# Patient Record
Sex: Female | Born: 1980 | Race: White | Hispanic: No | Marital: Married | Smoking: Former smoker
Health system: Southern US, Community
[De-identification: ages and names within clinical notes are randomized; demographics above are authoritative.]

## PROBLEM LIST (undated history)

## (undated) DIAGNOSIS — J189 Pneumonia, unspecified organism: Secondary | ICD-10-CM

## (undated) DIAGNOSIS — J309 Allergic rhinitis, unspecified: Secondary | ICD-10-CM

## (undated) DIAGNOSIS — E039 Hypothyroidism, unspecified: Secondary | ICD-10-CM

## (undated) DIAGNOSIS — G43909 Migraine, unspecified, not intractable, without status migrainosus: Secondary | ICD-10-CM

## (undated) DIAGNOSIS — K219 Gastro-esophageal reflux disease without esophagitis: Secondary | ICD-10-CM

## (undated) HISTORY — DX: Migraine, unspecified, not intractable, without status migrainosus: G43.909

## (undated) HISTORY — DX: Allergic rhinitis, unspecified: J30.9

---
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
## 2005-02-19 ENCOUNTER — Emergency Department (HOSPITAL_COMMUNITY): Admission: EM | Admit: 2005-02-19 | Discharge: 2005-02-19 | Payer: Self-pay | Admitting: Emergency Medicine

## 2013-10-22 ENCOUNTER — Ambulatory Visit
Admission: RE | Admit: 2013-10-22 | Discharge: 2013-10-22 | Disposition: A | Discharge status: Home / Self Care | Payer: BC Managed Care – PPO | Source: Ambulatory Visit | Attending: Family Medicine | Admitting: Family Medicine

## 2013-10-22 ENCOUNTER — Other Ambulatory Visit: Payer: Self-pay | Admitting: Family Medicine

## 2013-10-22 DIAGNOSIS — R05 Cough: Secondary | ICD-10-CM

## 2013-10-22 DIAGNOSIS — R053 Chronic cough: Secondary | ICD-10-CM

## 2013-11-16 ENCOUNTER — Encounter: Payer: Self-pay | Admitting: Critical Care Medicine

## 2013-11-17 ENCOUNTER — Encounter: Payer: Self-pay | Admitting: Critical Care Medicine

## 2013-11-17 ENCOUNTER — Ambulatory Visit (INDEPENDENT_AMBULATORY_CARE_PROVIDER_SITE_OTHER): Payer: BC Managed Care – PPO | Admitting: Critical Care Medicine

## 2013-11-17 VITALS — BP 126/68 | HR 98 | Temp 98.0°F | Ht 64.25 in | Wt 245.0 lb

## 2013-11-17 DIAGNOSIS — K219 Gastro-esophageal reflux disease without esophagitis: Secondary | ICD-10-CM

## 2013-11-17 DIAGNOSIS — G43909 Migraine, unspecified, not intractable, without status migrainosus: Secondary | ICD-10-CM | POA: Insufficient documentation

## 2013-11-17 DIAGNOSIS — J309 Allergic rhinitis, unspecified: Secondary | ICD-10-CM

## 2013-11-17 DIAGNOSIS — R059 Cough, unspecified: Secondary | ICD-10-CM

## 2013-11-17 DIAGNOSIS — J4540 Moderate persistent asthma, uncomplicated: Secondary | ICD-10-CM | POA: Insufficient documentation

## 2013-11-17 DIAGNOSIS — R05 Cough: Secondary | ICD-10-CM

## 2013-11-17 MED ORDER — MOMETASONE FUROATE 50 MCG/ACT NA SUSP: 2.0000 | Freq: Every day | NASAL | Status: DC

## 2013-11-17 MED ORDER — BECLOMETHASONE DIPROPIONATE 80 MCG/ACT IN AERS: 2.0000 | INHALATION_SPRAY | Freq: Two times a day (BID) | RESPIRATORY_TRACT | Status: DC

## 2013-11-17 MED ORDER — PREDNISONE 10 MG PO TABS: ORAL_TABLET | ORAL | Status: DC

## 2013-11-17 MED ORDER — HYDROCODONE-HOMATROPINE 5-1.5 MG/5ML PO SYRP: 5.0000 mL | ORAL_SOLUTION | Freq: Four times a day (QID) | ORAL | Status: DC | PRN

## 2013-11-17 MED ORDER — SALINE SPRAY 0.65 % NA SOLN: 2.0000 | Freq: Three times a day (TID) | NASAL | Status: DC

## 2013-11-17 MED ORDER — BENZONATATE 100 MG PO CAPS: ORAL_CAPSULE | ORAL | Status: DC

## 2013-11-17 NOTE — Patient Instructions (Signed)
Prednisone 10mg  Take 4 for three days 3 for three days 2 for three days 1 for three days and stop Nasonex two puff daily each nostril Start Qvar 80 two puff twice daily Start cough protocol with hycodan and benzonatate Stay on omeprazole daily Return 6 weeks

## 2013-11-17 NOTE — Progress Notes (Signed)
Subjective:    Patient ID: Ellen Parsons, female    DOB: December 03, 1980, 33 y.o.   MRN: [id number]  HPI Comments: Chronic cough x 3 months, constant.  Started January, better now worse  Cough This is a new problem. The current episode started more than 1 month ago. The problem has been gradually improving. The problem occurs every few minutes (was constant at first, now daily with talking will cough). The cough is non-productive. Associated symptoms include chest pain, shortness of breath and wheezing. Pertinent negatives include no chills, ear congestion, ear pain, eye redness, fever, headaches, heartburn, hemoptysis, myalgias, nasal congestion, postnasal drip, rash, rhinorrhea, sore throat, sweats or weight loss. Associated symptoms comments: Pain in L ribs , better with pred/cough med/augmentin Pt was started on PPI 3 weeks and no change No GERD symptoms Never sore throat.  Coughed to emesis Pt will wheeze to the end of the night. The symptoms are aggravated by fumes, exercise, stress and lying down (eating will make symptoms worse ). Risk factors for lung disease include smoking/tobacco exposure (quit smoking 2010, still has passive smoke exposure). She has tried a beta-agonist inhaler for the symptoms. The treatment provided moderate relief. There is no history of asthma, bronchiectasis, bronchitis, COPD, emphysema, environmental allergies or pneumonia.      Review of Systems  Constitutional: Negative for fever, chills, weight loss and unexpected weight change.  HENT: Negative for congestion, dental problem, ear pain, mouth sores, nosebleeds, postnasal drip, rhinorrhea, sinus pressure, sneezing, sore throat, tinnitus, trouble swallowing and voice change.   Eyes: Negative for redness and itching.  Respiratory: Positive for cough, chest tightness, shortness of breath and wheezing. Negative for hemoptysis.   Cardiovascular: Positive for chest pain. Negative for palpitations and leg  swelling.  Gastrointestinal: Negative for heartburn, nausea and vomiting.  Genitourinary: Negative for dysuria.  Musculoskeletal: Negative for joint swelling and myalgias.  Skin: Negative for rash.  Allergic/Immunologic: Negative for environmental allergies.  Neurological: Negative for headaches.  Hematological: Does not bruise/bleed easily.  Psychiatric/Behavioral: Negative for dysphoric mood. The patient is not nervous/anxious.        Objective:   Physical Exam Filed Vitals:   11/17/13 1540  BP: 126/68  Pulse: 98  Temp: 98 F (36.7 C)  TempSrc: Oral  Height: 5' 4.25" (1.632 m)  Weight: 111.131 kg (245 lb)  SpO2: 97%    Gen: Pleasant, well-nourished, in no distress,  normal affect  ENT: No lesions,  mouth clear,  oropharynx clear, no postnasal drip  Neck: No JVD, no TMG, no carotid bruits  Lungs: No use of accessory muscles, no dullness to percussion, upper and lower airway wheeze  Cardiovascular: RRR, heart sounds normal, no murmur or gallops, no peripheral edema  Abdomen: soft and NT, no HSM,  BS normal  Musculoskeletal: No deformities, no cyanosis or clubbing  Neuro: alert, non focal  Skin: Warm, no lesions or rashes  No results found. 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 omeprazole (PRILOSEC) 40 MG capsule Take 40 mg by mouth daily.  . [DISCONTINUED] guaiFENesin-codeine (ROBITUSSIN AC)  100-10 MG/5ML syrup Take 5 mLs by mouth 3 (three) times daily as needed for cough.  . beclomethasone (QVAR) 80 MCG/ACT inhaler Inhale 2 puffs into the lungs 2 (two) times daily.  . benzonatate (TESSALON) 100 MG capsule Take 1-2 every 4-6 hours per cough protocol  . HYDROcodone-homatropine (HYCODAN) 5-1.5 MG/5ML syrup Take 5 mLs by mouth every 6 (six) hours as needed for cough.  . mometasone (NASONEX) 50 MCG/ACT nasal spray Place 2 sprays into the nose daily.  . predniSONE (DELTASONE) 10 MG tablet Take 4 for three days 3 for three days 2 for three days 1 for three days and stop  . sodium chloride (OCEAN) 0.65 % SOLN nasal spray Place 2 sprays into both nostrils 3 (three) times daily.  . [DISCONTINUED] mometasone (NASONEX) 50 MCG/ACT nasal spray Place 2 sprays into the nose daily as needed.

## 2013-11-18 DIAGNOSIS — K219 Gastro-esophageal reflux disease without esophagitis: Secondary | ICD-10-CM | POA: Insufficient documentation

## 2013-11-18 NOTE — Assessment & Plan Note (Signed)
Cyclical cough and probable lower airway inflammation with asthma flare GERD a ppt factor Plan Prednisone 10mg  Take 4 for three days 3 for three days 2 for three days 1 for three days and stop Nasonex two puff daily each nostril Start Qvar 80 two puff twice daily Start cough protocol with hycodan and benzonatate Stay on omeprazole daily Return 6 weeks

## 2013-11-18 NOTE — Assessment & Plan Note (Signed)
GERD likely a ppt factor for cough

## 2013-11-24 ENCOUNTER — Telehealth: Payer: Self-pay | Admitting: Critical Care Medicine

## 2013-11-24 NOTE — Telephone Encounter (Signed)
Called spoke with pt. Made her aware will fax results. I have sent OV note and spirometry. Pt aware. Nothing further needed

## 2013-11-26 ENCOUNTER — Other Ambulatory Visit (HOSPITAL_COMMUNITY): Payer: Self-pay | Admitting: Otolaryngology

## 2013-11-26 DIAGNOSIS — R590 Localized enlarged lymph nodes: Secondary | ICD-10-CM

## 2013-11-27 ENCOUNTER — Ambulatory Visit (HOSPITAL_COMMUNITY)
Admission: RE | Admit: 2013-11-27 | Discharge: 2013-11-27 | Disposition: A | Discharge status: Home / Self Care | Payer: BC Managed Care – PPO | Source: Ambulatory Visit | Attending: Otolaryngology | Admitting: Otolaryngology

## 2013-11-27 ENCOUNTER — Other Ambulatory Visit (HOSPITAL_COMMUNITY): Payer: Self-pay | Admitting: Otolaryngology

## 2013-11-27 DIAGNOSIS — R590 Localized enlarged lymph nodes: Secondary | ICD-10-CM

## 2013-11-27 DIAGNOSIS — R599 Enlarged lymph nodes, unspecified: Secondary | ICD-10-CM | POA: Insufficient documentation

## 2013-11-30 ENCOUNTER — Encounter (HOSPITAL_COMMUNITY): Payer: Self-pay

## 2013-11-30 ENCOUNTER — Ambulatory Visit (HOSPITAL_COMMUNITY)
Admission: RE | Admit: 2013-11-30 | Discharge: 2013-11-30 | Disposition: A | Discharge status: Home / Self Care | Payer: BC Managed Care – PPO | Source: Ambulatory Visit | Attending: Otolaryngology | Admitting: Otolaryngology

## 2013-11-30 DIAGNOSIS — R590 Localized enlarged lymph nodes: Secondary | ICD-10-CM

## 2013-11-30 DIAGNOSIS — R599 Enlarged lymph nodes, unspecified: Secondary | ICD-10-CM | POA: Insufficient documentation

## 2013-11-30 MED ORDER — IOHEXOL 300 MG/ML  SOLN
100.0000 mL | Freq: Once | INTRAMUSCULAR | Status: AC | PRN
  Administered 2013-11-30: 100 mL via INTRAVENOUS

## 2013-12-02 ENCOUNTER — Other Ambulatory Visit (HOSPITAL_COMMUNITY): Payer: Self-pay | Admitting: Otolaryngology

## 2013-12-02 DIAGNOSIS — R599 Enlarged lymph nodes, unspecified: Secondary | ICD-10-CM

## 2013-12-04 ENCOUNTER — Other Ambulatory Visit: Payer: Self-pay | Admitting: Radiology

## 2013-12-07 ENCOUNTER — Encounter (HOSPITAL_COMMUNITY): Payer: Self-pay

## 2013-12-07 ENCOUNTER — Ambulatory Visit (HOSPITAL_COMMUNITY)
Admission: RE | Admit: 2013-12-07 | Discharge: 2013-12-07 | Disposition: A | Discharge status: Home / Self Care | Payer: BC Managed Care – PPO | Source: Ambulatory Visit | Attending: Otolaryngology | Admitting: Otolaryngology

## 2013-12-07 DIAGNOSIS — R599 Enlarged lymph nodes, unspecified: Secondary | ICD-10-CM | POA: Insufficient documentation

## 2013-12-07 LAB — APTT: aPTT: 29 seconds (ref 24–37)

## 2013-12-07 LAB — CBC
HEMATOCRIT: 43 % (ref 36.0–46.0)
Hemoglobin: 15.3 g/dL — ABNORMAL HIGH (ref 12.0–15.0)
MCH: 30.5 pg (ref 26.0–34.0)
MCHC: 35.6 g/dL (ref 30.0–36.0)
MCV: 85.8 fL (ref 78.0–100.0)
Platelets: 240 10*3/uL (ref 150–400)
RBC: 5.01 MIL/uL (ref 3.87–5.11)
RDW: 13.3 % (ref 11.5–15.5)
WBC: 11.5 10*3/uL — AB (ref 4.0–10.5)

## 2013-12-07 LAB — PROTIME-INR
INR: 1.01 (ref 0.00–1.49)
Prothrombin Time: 13.1 seconds (ref 11.6–15.2)

## 2013-12-07 MED ORDER — MIDAZOLAM HCL 2 MG/2ML IJ SOLN
INTRAMUSCULAR | Status: AC
  Filled 2013-12-07: qty 4

## 2013-12-07 MED ORDER — FENTANYL CITRATE 0.05 MG/ML IJ SOLN
INTRAMUSCULAR | Status: AC | PRN
  Administered 2013-12-07: 25 ug via INTRAVENOUS
  Administered 2013-12-07: 50 ug via INTRAVENOUS

## 2013-12-07 MED ORDER — SODIUM CHLORIDE 0.9 % IV SOLN: Freq: Once | INTRAVENOUS | Status: DC

## 2013-12-07 MED ORDER — MIDAZOLAM HCL 2 MG/2ML IJ SOLN
INTRAMUSCULAR | Status: AC | PRN
  Administered 2013-12-07 (×2): 1 mg via INTRAVENOUS

## 2013-12-07 MED ORDER — FENTANYL CITRATE 0.05 MG/ML IJ SOLN
INTRAMUSCULAR | Status: AC
  Filled 2013-12-07: qty 2

## 2013-12-07 NOTE — Discharge Instructions (Signed)
Biopsy °Care After °These instructions give you information on caring for yourself after your procedure. Your doctor may also give you more specific instructions. Call your doctor if you have any problems or questions after your procedure. °HOME CARE  °· Return to your normal diet and activities as told by your doctor. °· Change your bandages (dressings) as told by your doctor. If skin glue (adhesive) was used, it will peel off in 7 days. °· Only take medicines as told by your doctor. °· Ask your doctor when you can bathe and get your wound wet. °GET HELP RIGHT AWAY IF: °· You see more than a small spot of blood coming from the wound. °· You have redness, puffiness (swelling), or pain. °· You see yellowish-white fluid (pus) coming from the wound. °· You have a fever. °· You notice a bad smell coming from the wound or bandage. °· You have a rash, trouble breathing, or any allergy problems. °MAKE SURE YOU:  °· Understand these instructions. °· Will watch your condition. °· Will get help right away if you are not doing well or get worse. °Document Released: 04/24/2011 Document Revised: 11/12/2011 Document Reviewed: 04/24/2011 °[employer]® Patient Information ©2014 [employer]. ° °

## 2013-12-07 NOTE — Procedures (Signed)
Procedure:  US guided core biopsy of left supraclavicular lymph node 18 G core biopsy samples x 5 of left [address] LN.   No complications.

## 2013-12-07 NOTE — H&P (Signed)
Chief Complaint: "I'm here for a biopsy" Referring Physician:Gore HPI: Ellen Parsons is an 33 y.o. female with (L)supraclavicular lymphadenopathy. She is scheduled today for biopsy. She states this has been present about 2+ months. She was recently completed a course of antibiotics and prednisone for 'walking pneumonia'. She still has some cough/congestion, but denies fever, chills, SOB. She reports no change in the left lymph node now that she has completed the course of treatment. PMHx and meds reviewed.   Past Medical History:  Past Medical History  Diagnosis Date  . Allergic rhinitis   . Migraine     Past Surgical History:  Past Surgical History  Procedure Laterality Date  . Anterior cruciate ligament repair Left 1999    Family History:  Family History  Problem Relation Age of Onset  . Asthma Sister   . Allergies Mother   . Heart disease Maternal Grandfather     CHF  . Heart disease Father   . Heart disease Paternal Grandmother   . Cancer Paternal Grandfather     lung    Social History:  reports that she quit smoking about 5 years ago. Her smoking use included Cigarettes. She has a 1.25 pack-year smoking history. She has never used smokeless tobacco. She reports that she does not drink alcohol or use illicit drugs.  Allergies:  Allergies  Allergen Reactions  . Sulfa Antibiotics     childhood    Medications:   Medication List    ASK your doctor about these medications       beclomethasone 80 MCG/ACT inhaler  Commonly known as:  QVAR  Inhale 2 puffs into the lungs 2 (two) times daily.     benzonatate 100 MG capsule  Commonly known as:  TESSALON  Take 100-200 mg by mouth 2 (two) times daily as needed for cough.     mometasone 50 MCG/ACT nasal spray  Commonly known as:  NASONEX  Place 2 sprays into the nose daily.     omeprazole 40 MG capsule  Commonly known as:  PRILOSEC  Take 40 mg by mouth daily.        Please HPI for pertinent positives,  otherwise complete 10 system ROS negative.  Physical Exam: BP 130/86  Pulse 93  Temp(Src) 98.2 F (36.8 C) (Oral)  Resp 18  Ht 5\' 4"  (1.626 m)  Wt 170 lb (77.111 kg)  BMI 29.17 kg/m2  SpO2 97%  LMP 11/12/2013 Body mass index is 29.17 kg/(m^2).   General Appearance:  Alert, cooperative, no distress, appears stated age  Head:  Normocephalic, without obvious abnormality, atraumatic  ENT: Unremarkable  Neck: Supple, symmetrical, trachea midline. Faintly palpable (L)spuraclavicular LN, NT  Lungs:   Clear to auscultation bilaterally, no w/r/r, respirations unlabored without use of accessory muscles.  Chest Wall:  No tenderness or deformity  Heart:  Regular rate and rhythm, S1, S2 normal, no murmur, rub or gallop.  Neurologic: Normal affect, no gross deficits.   Results for orders placed during the hospital encounter of 12/07/13 (from the past 48 hour(s))  CBC     Status: Abnormal   Collection Time    12/07/13 12:17 PM      Result Value Ref Range   WBC 11.5 (*) 4.0 - 10.5 K/uL   RBC 5.01  3.87 - 5.11 MIL/uL   Hemoglobin 15.3 (*) 12.0 - 15.0 g/dL   HCT 40.943.0  81.136.0 - 91.446.0 %   MCV 85.8  78.0 - 100.0 fL   MCH 30.5  26.0 -  34.0 pg   MCHC 35.6  30.0 - 36.0 g/dL   RDW 16.1  09.6 - 04.5 %   Platelets 240  150 - 400 K/uL   No results found.  Assessment/Plan (L)supraclavicular lymph node of uncertain etiology Discussed US guided LN biopsy Explained procedure, risks, complications, use of sedation. Labs pending Consent signed in chart  Brayton El PA-C 12/07/2013, 12:58 PM

## 2013-12-07 NOTE — H&P (Signed)
Agree.  For left  LN biopsy today under US.

## 2013-12-21 ENCOUNTER — Telehealth: Payer: Self-pay | Admitting: Oncology

## 2013-12-21 NOTE — Telephone Encounter (Signed)
LEFT MESSAGE FOR PATIENT TO RETURN CALL TO SCHEDULE NEW PATIENT APPT.  °

## 2013-12-22 ENCOUNTER — Encounter: Payer: Self-pay | Admitting: Critical Care Medicine

## 2013-12-22 ENCOUNTER — Telehealth: Payer: Self-pay | Admitting: Oncology

## 2013-12-22 ENCOUNTER — Ambulatory Visit (INDEPENDENT_AMBULATORY_CARE_PROVIDER_SITE_OTHER): Payer: BC Managed Care – PPO | Admitting: Critical Care Medicine

## 2013-12-22 VITALS — BP 124/76 | HR 91 | Temp 98.4°F | Ht 64.0 in | Wt 244.0 lb

## 2013-12-22 DIAGNOSIS — R059 Cough, unspecified: Secondary | ICD-10-CM

## 2013-12-22 DIAGNOSIS — J45909 Unspecified asthma, uncomplicated: Secondary | ICD-10-CM

## 2013-12-22 DIAGNOSIS — J4540 Moderate persistent asthma, uncomplicated: Secondary | ICD-10-CM

## 2013-12-22 DIAGNOSIS — R05 Cough: Secondary | ICD-10-CM

## 2013-12-22 MED ORDER — ALBUTEROL SULFATE HFA 108 (90 BASE) MCG/ACT IN AERS: 2.0000 | INHALATION_SPRAY | Freq: Four times a day (QID) | RESPIRATORY_TRACT | Status: DC | PRN

## 2013-12-22 MED ORDER — MOMETASONE FUROATE 50 MCG/ACT NA SUSP: 2.0000 | Freq: Every day | NASAL | Status: DC

## 2013-12-22 MED ORDER — BECLOMETHASONE DIPROPIONATE 80 MCG/ACT IN AERS: 2.0000 | INHALATION_SPRAY | Freq: Every day | RESPIRATORY_TRACT | Status: DC

## 2013-12-22 NOTE — Patient Instructions (Addendum)
Reduce qvar to two puffs daily Proair refilled Stay on nasonex daily Benzonatate as needed Omeprazole daily Return 4 months

## 2013-12-22 NOTE — Telephone Encounter (Signed)
S/W PATIENT AND GVE NEW PATIENT APPT FOR 05/01 @ 1:30 W/DR. SHADAD REFERRING [facility]  DX- MILD PERSISTENT ELEVATION IF WBC CT WELCOME PACKET MAILED.

## 2013-12-22 NOTE — Progress Notes (Signed)
Subjective:    Patient ID: Ellen Parsons, female    DOB: March 09, 1981, 33 y.o.   MRN: [id number]  HPI Comments: Chronic cough x 3 months, constant.  Started January, better now worse  Cough This is a new problem. The current episode started more than 1 month ago. The problem has been gradually improving. The problem occurs every few minutes (was constant at first, now daily with talking will cough). The cough is non-productive. Associated symptoms include chest pain, shortness of breath and wheezing. Pertinent negatives include no chills, ear congestion, ear pain, eye redness, fever, headaches, heartburn, hemoptysis, myalgias, nasal congestion, postnasal drip, rash, rhinorrhea, sore throat, sweats or weight loss. Associated symptoms comments: Pain in L ribs , better with pred/cough med/augmentin Pt was started on PPI 3 weeks and no change No GERD symptoms Never sore throat.  Coughed to emesis Pt will wheeze to the end of the night. The symptoms are aggravated by fumes, exercise, stress and lying down (eating will make symptoms worse ). Risk factors for lung disease include smoking/tobacco exposure (quit smoking 2010, still has passive smoke exposure). She has tried a beta-agonist inhaler for the symptoms. The treatment provided moderate relief. There is no history of asthma, bronchiectasis, bronchitis, COPD, emphysema, environmental allergies or pneumonia.   12/22/2013 Chief Complaint  Patient presents with  . 6 wk follow up    Cough has improved - does still cough some qhs - nonprod.  Also, wakes up feeling "wheezy" qhs  Overal is better. Less cough. Less dyspnea. Pt denies any significant sore throat, nasal congestion or excess secretions, fever, chills, sweats, unintended weight loss, pleurtic or exertional chest pain, orthopnea PND, or leg swelling Pt denies any increase in rescue therapy over baseline, denies waking up needing it or having any early am or nocturnal exacerbations of  coughing/wheezing/or dyspnea. Pt also denies any obvious fluctuation in symptoms with  weather or environmental change or other alleviating or aggravating factors   Note had L SUpraclav LN Bx done>>neg Note CT Chest Neg for lymphadenopathy     Review of Systems  Constitutional: Negative for fever, chills, weight loss and unexpected weight change.  HENT: Negative for congestion, dental problem, ear pain, mouth sores, nosebleeds, postnasal drip, rhinorrhea, sinus pressure, sneezing, sore throat, tinnitus, trouble swallowing and voice change.   Eyes: Negative for redness and itching.  Respiratory: Positive for cough, chest tightness, shortness of breath and wheezing. Negative for hemoptysis.   Cardiovascular: Positive for chest pain. Negative for palpitations and leg swelling.  Gastrointestinal: Negative for heartburn, nausea and vomiting.  Genitourinary: Negative for dysuria.  Musculoskeletal: Negative for joint swelling and myalgias.  Skin: Negative for rash.  Allergic/Immunologic: Negative for environmental allergies.  Neurological: Negative for headaches.  Hematological: Does not bruise/bleed easily.  Psychiatric/Behavioral: Negative for dysphoric mood. The patient is not nervous/anxious.        Objective:   Physical Exam  Filed Vitals:   12/22/13 1354  BP: 124/76  Pulse: 91  Temp: 98.4 F (36.9 C)  TempSrc: Oral  Height: 5\' 4"  (1.626 m)  Weight: 244 lb (110.678 kg)  SpO2: 97%    Gen: Pleasant, well-nourished, in no distress,  normal affect  ENT: No lesions,  mouth clear,  oropharynx clear, no postnasal drip  Neck: No JVD, no TMG, no carotid bruits  Lungs: No use of accessory muscles, no dullness to percussion, upper and lower airway wheeze  Cardiovascular: RRR, heart sounds normal, no murmur or gallops, no peripheral edema  Abdomen:  soft and NT, no HSM,  BS normal  Musculoskeletal: No deformities, no cyanosis or clubbing  Neuro: alert, non focal  Skin:  Warm, no lesions or rashes  No results found. CXR NAD  CT Chest 11/30/13: FINDINGS:  No evidence of mediastinal or hilar lymphadenopathy. No evidence of  axillary or chest wall lymphadenopathy. No evidence of pulmonary  mass or infiltrate. No central endobronchial lesion identified no  evidence of pleural or pericardial effusion.  Both adrenal glands are normal in appearance. No suspicious bone  lesions identified.  IMPRESSION:  Negative. No evidence of thoracic lymphadenopathy or mass.          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 Ascorbic Acid (VITAMIN C GUMMIE PO) Take by mouth daily.  . beclomethasone (QVAR) 80 MCG/ACT inhaler Inhale 2 puffs into the lungs daily.  . benzonatate (TESSALON) 100 MG capsule Take 100-200 mg by mouth 2 (two) times daily as needed for cough.  . mometasone (NASONEX) 50 MCG/ACT nasal spray Place 2 sprays into the nose daily.  . Multiple Vitamins-Minerals (MULTI-VITAMIN GUMMIES PO) Take by mouth daily.  Marland Kitchen. omeprazole (PRILOSEC) 40 MG capsule Take 40 mg by mouth daily.  . [DISCONTINUED] beclomethasone (QVAR) 80 MCG/ACT inhaler Inhale 2 puffs into the lungs 2 (two) times daily.  . [DISCONTINUED] mometasone (NASONEX) 50 MCG/ACT nasal spray Place 2 sprays into the nose daily.  Marland Kitchen. albuterol (PROAIR HFA) 108 (90 BASE) MCG/ACT inhaler Inhale 2 puffs into the lungs every 6 (six) hours as needed.  . [DISCONTINUED] PROAIR HFA 108 (90 BASE) MCG/ACT inhaler Inhale 2 puffs into the lungs every 6 (six) hours as needed.

## 2013-12-24 ENCOUNTER — Telehealth: Payer: Self-pay | Admitting: Oncology

## 2013-12-24 NOTE — Telephone Encounter (Signed)
C/D 12/24/13 for appt. 01/01/14

## 2013-12-24 NOTE — Assessment & Plan Note (Signed)
Cyclic cough with mild lower airway inflammation improved with GERD as ppt factor Plan Reduce qvar to two puffs daily Proair refilled Stay on nasonex daily Benzonatate as needed Omeprazole daily Return 4 months

## 2013-12-25 ENCOUNTER — Other Ambulatory Visit: Payer: Self-pay | Admitting: Oncology

## 2013-12-25 DIAGNOSIS — D72829 Elevated white blood cell count, unspecified: Secondary | ICD-10-CM

## 2013-12-29 ENCOUNTER — Other Ambulatory Visit: Payer: Self-pay | Admitting: *Deleted

## 2013-12-29 MED ORDER — BECLOMETHASONE DIPROPIONATE 80 MCG/ACT IN AERS: 2.0000 | INHALATION_SPRAY | Freq: Every day | RESPIRATORY_TRACT | Status: DC

## 2013-12-29 MED ORDER — ALBUTEROL SULFATE HFA 108 (90 BASE) MCG/ACT IN AERS: 2.0000 | INHALATION_SPRAY | Freq: Four times a day (QID) | RESPIRATORY_TRACT | Status: DC | PRN

## 2013-12-29 MED ORDER — MOMETASONE FUROATE 50 MCG/ACT NA SUSP: 2.0000 | Freq: Every day | NASAL | Status: DC

## 2013-12-29 NOTE — Telephone Encounter (Signed)
Received faxed refill request for flonase, qvar, and proair from [facility].  Rxs sent.

## 2013-12-31 ENCOUNTER — Telehealth: Payer: Self-pay | Admitting: Medical Oncology

## 2013-12-31 NOTE — Telephone Encounter (Signed)
Confirmed tomorrow's appt with patient, requested she bring a current list of her medications and informed her of clinic's free valet parking. Patient expressed thanks and denies questions at this time.

## 2014-01-01 ENCOUNTER — Other Ambulatory Visit (HOSPITAL_BASED_OUTPATIENT_CLINIC_OR_DEPARTMENT_OTHER): Payer: BC Managed Care – PPO

## 2014-01-01 ENCOUNTER — Encounter: Payer: Self-pay | Admitting: Oncology

## 2014-01-01 ENCOUNTER — Ambulatory Visit (HOSPITAL_BASED_OUTPATIENT_CLINIC_OR_DEPARTMENT_OTHER): Payer: BC Managed Care – PPO | Admitting: Oncology

## 2014-01-01 ENCOUNTER — Ambulatory Visit: Payer: BC Managed Care – PPO

## 2014-01-01 VITALS — BP 127/66 | HR 108 | Temp 98.1°F | Resp 20 | Ht 64.0 in | Wt 248.6 lb

## 2014-01-01 DIAGNOSIS — R05 Cough: Secondary | ICD-10-CM

## 2014-01-01 DIAGNOSIS — R0602 Shortness of breath: Secondary | ICD-10-CM

## 2014-01-01 DIAGNOSIS — D72829 Elevated white blood cell count, unspecified: Secondary | ICD-10-CM

## 2014-01-01 DIAGNOSIS — R059 Cough, unspecified: Secondary | ICD-10-CM

## 2014-01-01 LAB — COMPREHENSIVE METABOLIC PANEL (CC13)
ALK PHOS: 77 U/L (ref 40–150)
ALT: 9 U/L (ref 0–55)
AST: 11 U/L (ref 5–34)
Albumin: 3.4 g/dL — ABNORMAL LOW (ref 3.5–5.0)
Anion Gap: 7 mEq/L (ref 3–11)
BUN: 10.3 mg/dL (ref 7.0–26.0)
CO2: 24 mEq/L (ref 22–29)
Calcium: 9.4 mg/dL (ref 8.4–10.4)
Chloride: 108 mEq/L (ref 98–109)
Creatinine: 0.8 mg/dL (ref 0.6–1.1)
Glucose: 100 mg/dl (ref 70–140)
POTASSIUM: 4.1 meq/L (ref 3.5–5.1)
SODIUM: 139 meq/L (ref 136–145)
TOTAL PROTEIN: 6.3 g/dL — AB (ref 6.4–8.3)
Total Bilirubin: 0.2 mg/dL (ref 0.20–1.20)

## 2014-01-01 LAB — CBC WITH DIFFERENTIAL/PLATELET
BASO%: 0.5 % (ref 0.0–2.0)
Basophils Absolute: 0.1 10*3/uL (ref 0.0–0.1)
EOS%: 3.2 % (ref 0.0–7.0)
Eosinophils Absolute: 0.3 10*3/uL (ref 0.0–0.5)
HCT: 44.1 % (ref 34.8–46.6)
HGB: 14.9 g/dL (ref 11.6–15.9)
LYMPH%: 20.5 % (ref 14.0–49.7)
MCH: 29.3 pg (ref 25.1–34.0)
MCHC: 33.9 g/dL (ref 31.5–36.0)
MCV: 86.4 fL (ref 79.5–101.0)
MONO#: 0.7 10*3/uL (ref 0.1–0.9)
MONO%: 6.6 % (ref 0.0–14.0)
NEUT#: 7.6 10*3/uL — ABNORMAL HIGH (ref 1.5–6.5)
NEUT%: 69.2 % (ref 38.4–76.8)
PLATELETS: 244 10*3/uL (ref 145–400)
RBC: 5.11 10*6/uL (ref 3.70–5.45)
RDW: 13.6 % (ref 11.2–14.5)
WBC: 11 10*3/uL — ABNORMAL HIGH (ref 3.9–10.3)
lymph#: 2.3 10*3/uL (ref 0.9–3.3)

## 2014-01-01 LAB — CHCC SMEAR

## 2014-01-01 NOTE — Progress Notes (Signed)
Checked in new patient with no financial issues. She has appt card and has not been out of country °

## 2014-01-01 NOTE — Progress Notes (Signed)
Please see consult note.  

## 2014-01-01 NOTE — Consult Note (Signed)
Reason for Referral: Leukocytosis.   HPI: 33 [place] of [place] has living for the last 5 years. She is a woman with past medical history significant for migraines and hypothyroidism who developed symptoms of persistent cough for the last 4 months. It started in January of 2015 where she was prescribed a Z-Pak with some improvement but subsequently have relapse of her symptoms back in for poor. At that time she was found to have a palpable cervical lymph node and was evaluated by ENT. She underwent a lymph node biopsy on 12/07/2013 which showed predominantly lymphoid tissue. During this process, she was noted to have an elevated white cell count up to 15,000 and have drifted down to 13,000 and on 12/07/2013 was 11.5 K. was referred to me for evaluation of leukocytosis. Clinically, she still have occasional cough and shortness of breath. She was evaluated by Dr. Delford FieldWright a pulmonary medicine and was prescribed inhalers as well as decongestants. Does have helped her symptoms at this time. She has not reported any other lymphadenopathy. She has not had any fevers or chills or sweats. Her appetite have been reasonable and her weight have been increased since her thyroid medicine have been discontinued. She has not reported any abdominal pain or early satiety. She continued to work full time and planning to get married in the near future.   Past Medical History  Diagnosis Date  . Allergic rhinitis   . Migraine   :  Past Surgical History  Procedure Laterality Date  . Anterior cruciate ligament repair Left 1999  :  Current Outpatient Prescriptions  Medication Sig Dispense Refill  . albuterol (PROAIR HFA) 108 (90 BASE) MCG/ACT inhaler Inhale 2 puffs into the lungs every 6 (six) hours as needed.  2 Inhaler  3  . Ascorbic Acid (VITAMIN C GUMMIE PO) Take by mouth daily.      . beclomethasone (QVAR) 80 MCG/ACT inhaler Inhale 2 puffs into the lungs daily.  2 Inhaler  4   . benzonatate (TESSALON) 100 MG capsule Take 100-200 mg by mouth 2 (two) times daily as needed for cough.      . mometasone (NASONEX) 50 MCG/ACT nasal spray Place 2 sprays into the nose daily.  51 g  4  . Multiple Vitamins-Minerals (MULTI-VITAMIN GUMMIES PO) Take by mouth daily.      Marland Kitchen. omeprazole (PRILOSEC) 40 MG capsule Take 40 mg by mouth daily.       No current facility-administered medications for this visit.       Allergies  Allergen Reactions  . Sulfa Antibiotics     childhood  :  Family History  Problem Relation Age of Onset  . Asthma Sister   . Allergies Mother   . Heart disease Maternal Grandfather     CHF  . Heart disease Father   . Heart disease Paternal Grandmother   . Cancer Paternal Grandfather     lung  :  History   Social History  . Marital Status: Single    Spouse Name: N/A    Number of Children: N/A  . Years of Education: N/A   Occupational History  . Not on file.   Social History Main Topics  . Smoking status: Former Smoker -- 0.25 packs/day for 5 years    Types: Cigarettes    Quit date: 09/03/2008  . Smokeless tobacco: Never Used     Comment: former social smoker  . Alcohol Use: No  . Drug Use: No  . Sexual  Activity: Not on file   Other Topics Concern  . Not on file   Social History Narrative  . No narrative on file  :  Constitutional: negative for anorexia, chills and fatigue Eyes: negative for icterus and irritation Ears, nose, mouth, throat, and face: negative for epistaxis, hoarseness and nasal congestion Respiratory: positive for cough, dyspnea on exertion and wheezing, negative for sputum and stridor Cardiovascular: negative for orthopnea, palpitations and paroxysmal nocturnal dyspnea Gastrointestinal: negative for abdominal pain, nausea and vomiting Genitourinary:negative for dysuria, frequency and urinary incontinence Integument/breast: negative for pruritus and rash Hematologic/lymphatic: negative for bleeding, easy  bruising and lymphadenopathy Musculoskeletal:negative for arthralgias and stiff joints Neurological: negative for dizziness and seizures Behavioral/Psych: negative for anxiety and depression Endocrine: negative for temperature intolerance Allergic/Immunologic: Increased seasonal allergies.   Exam: ECOG 0 Blood pressure 127/66, pulse 108, temperature 98.1 F (36.7 C), temperature source Oral, resp. rate 20, height 5\' 4"  (1.626 m), weight 248 lb 9.6 oz (112.764 kg), SpO2 100.00%. General appearance: alert, cooperative and appears stated age Nose: Nares normal. Septum midline. Mucosa normal. No drainage or sinus tenderness. Throat: lips, mucosa, and tongue normal; teeth and gums normal Neck: no adenopathy, no carotid bruit, no JVD, supple, symmetrical, trachea midline and thyroid not enlarged, symmetric, no tenderness/mass/nodules Back: symmetric, no curvature. ROM normal. No CVA tenderness. Resp: clear to auscultation bilaterally Chest wall: no tenderness Cardio: regular rate and rhythm, S1, S2 normal, no murmur, click, rub or gallop GI: soft, non-tender; bowel sounds normal; no masses,  no organomegaly Extremities: extremities normal, atraumatic, no cyanosis or edema Pulses: 2+ and symmetric Skin: Skin color, texture, turgor normal. No rashes or lesions Lymph nodes: Cervical, supraclavicular, and axillary nodes normal. Neurologic: Grossly normal   Recent Labs  01/01/14 1336  WBC 11.0*  HGB 14.9  HCT 44.1  PLT 244    Recent Labs  01/01/14 1336  NA 139  K 4.1  CO2 24  GLUCOSE 100  BUN 10.3  CREATININE 0.8  CALCIUM 9.4     Blood smear review: Her peripheral smear was personally reviewed and showed no malignant-appearing white cells. There is no immature cells or blasts. The lymphocytes and neutrophils appeared mature and appropriate. But cells and pulse are all normal.   CLINICAL DATA: Left supraclavicular lymphadenopathy on physical  exam.  EXAM:  CT CHEST WITH  CONTRAST  TECHNIQUE:  Multidetector CT imaging of the chest was performed during  intravenous contrast administration.  CONTRAST: OMNIPAQUE IOHEXOL 300 MG/ML SOLN  COMPARISON: None.  FINDINGS:  No evidence of mediastinal or hilar lymphadenopathy. No evidence of  axillary or chest wall lymphadenopathy. No evidence of pulmonary  mass or infiltrate. No central endobronchial lesion identified no  evidence of pleural or pericardial effusion.  Both adrenal glands are normal in appearance. No suspicious bone  lesions identified.  IMPRESSION:  Negative. No evidence of thoracic lymphadenopathy or mass.    Assessment and Plan:   33 year old woman with a leukocytosis. The differential diagnosis was discussed today with the patient and her future husband. The most likely etiology of her leukocytosis is reactive. This will be due to reactive airway disease, asthma, allergy or possible pneumonia that she has been treated with. Her white cell count is almost back to normal today with a normal differential and a normal peripheral smear.  Primary causes such as leukemia in myeloproliferative disorders are extremely unlikely. I am obtaining a BCR- ABL to rule out chronic myelogenous leukemia as the most likely condition in this category.  If her testing all within normal range, no further intervention or workup is needed from a hematology standpoint. I anticipate her white cell count will fluctuate depending on her pulmonary or allergy symptoms.   No further followup will be scheduled but I will be happy to see her in the future as needed. Certainly if her BCR ABL testing is positive then we will certainly change her followup appointments.

## 2014-06-18 ENCOUNTER — Other Ambulatory Visit: Payer: Self-pay

## 2014-09-21 IMAGING — CT CT CHEST W/ CM
2 of 3 series · 15 of 36 positions shown, 18 images · IV contrast (Omni 300)
Comparison: None.

CLINICAL DATA: Left supraclavicular lymphadenopathy on physical
exam.

EXAM:
CT CHEST WITH CONTRAST
TECHNIQUE: Multidetector CT imaging of the chest was performed during
intravenous contrast administration.
CONTRAST:  100mL OMNIPAQUE IOHEXOL 300 MG/ML  SOLN

[Series 2: thorax 5.0 i31f 1 · axial · 0.66mm/px · z∈[+1062,+1312]mm · 12 of 60 slices shown, 15 images]
[im 5/60  mediastinal]
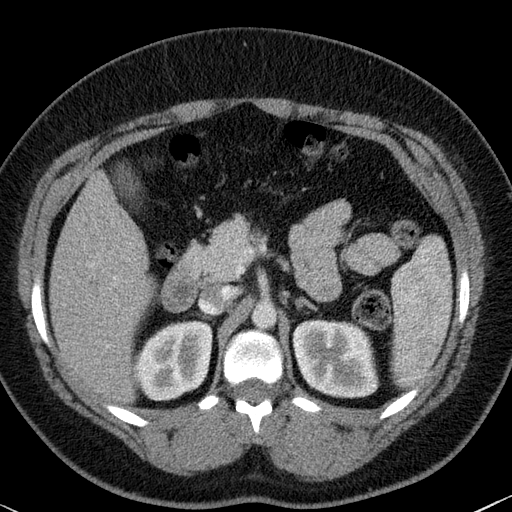
[im 5/60  lung]
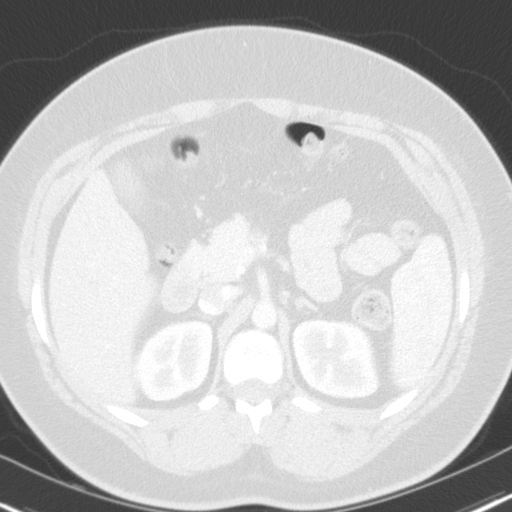
[im 9/60  lung]
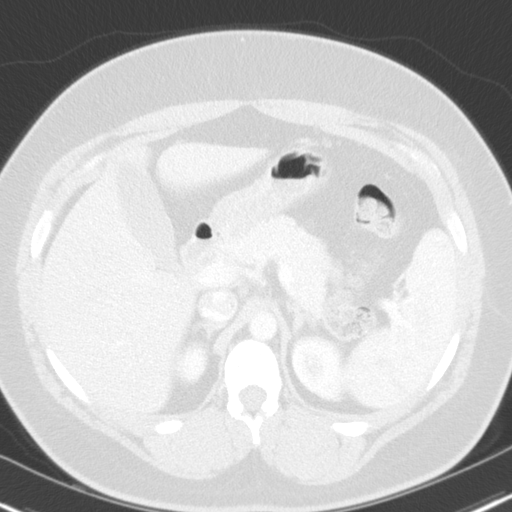
[im 14/60  lung]
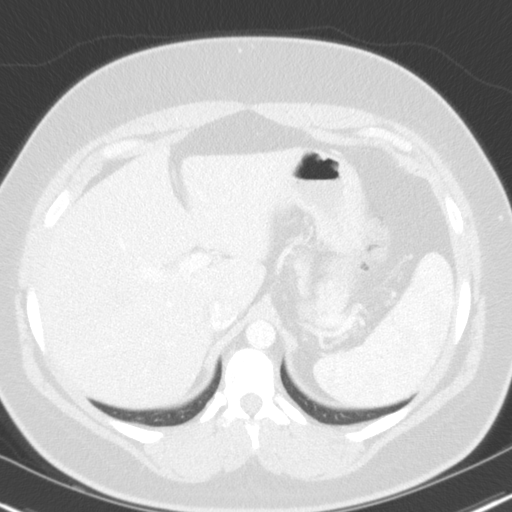
[im 18/60  lung]
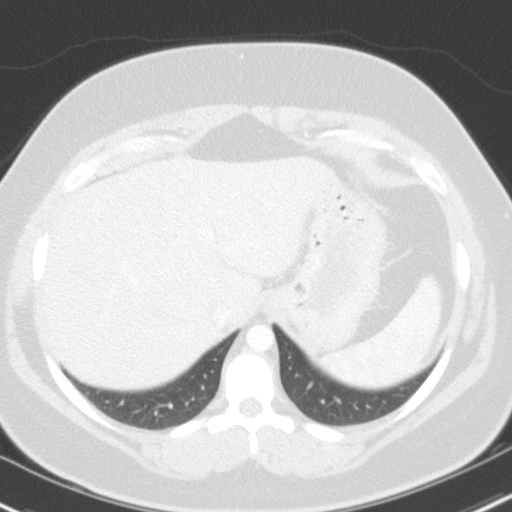
[im 22/60  mediastinal]
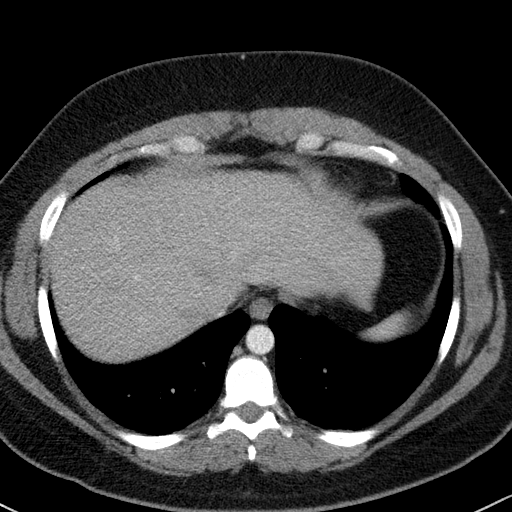
[im 22/60  lung]
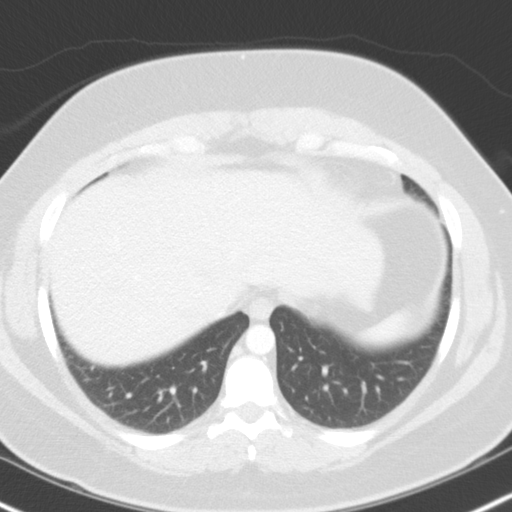
[im 27/60  lung]
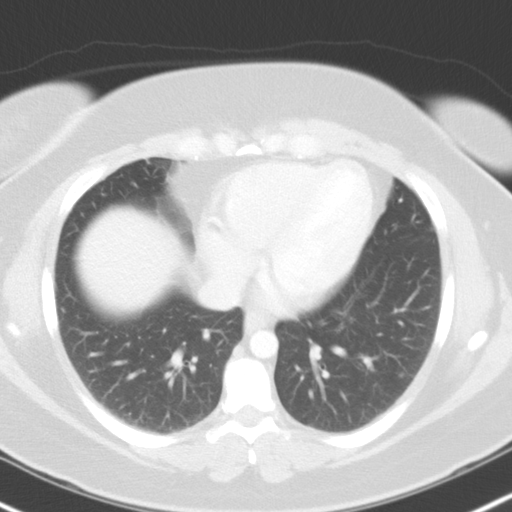
[im 33/60  lung]
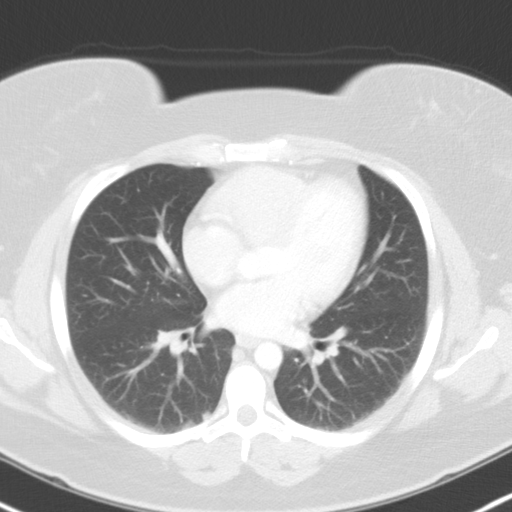
[im 38/60  lung]
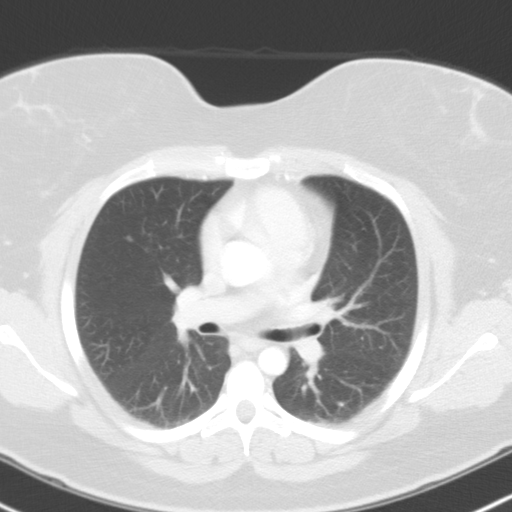
[im 42/60  mediastinal]
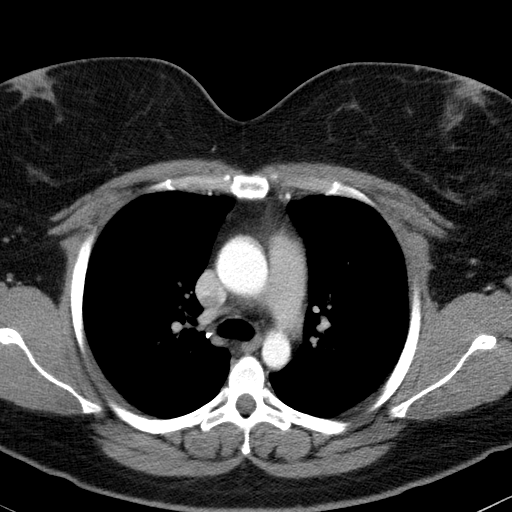
[im 42/60  lung]
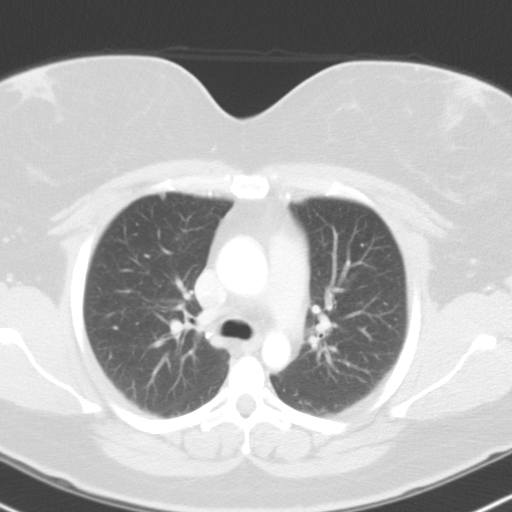
[im 46/60  lung]
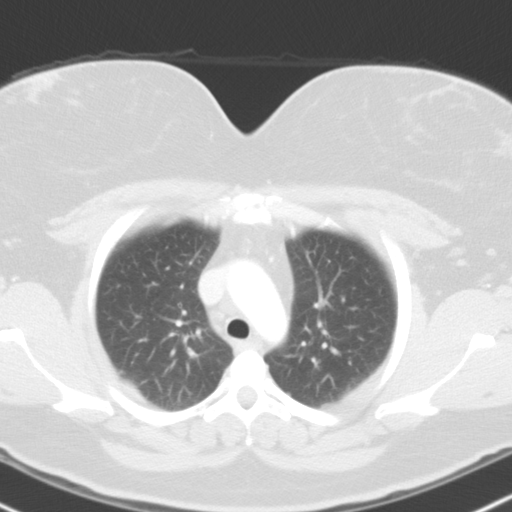
[im 51/60  lung]
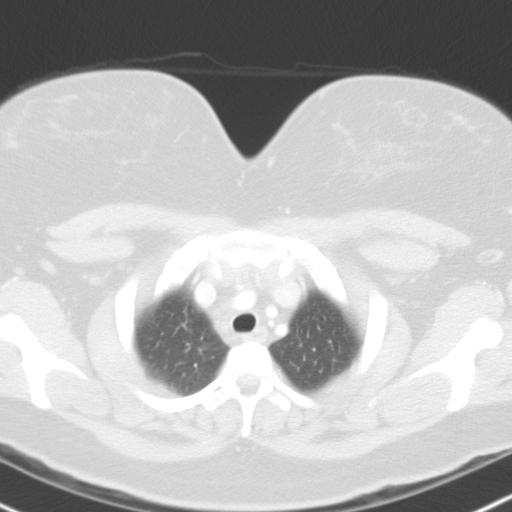
[im 55/60  lung]
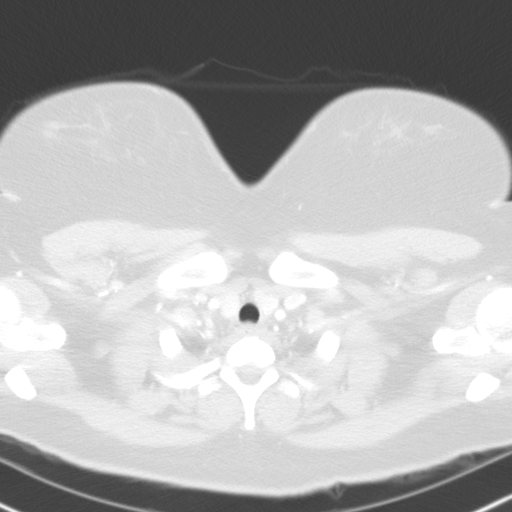

[Series 5: coronal · coronal · 0.59mm/px · 3 of 76 slices shown]
[im 16/76  lung]
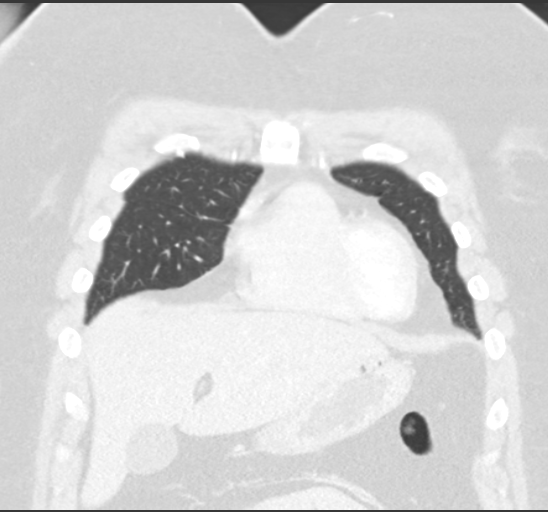
[im 31/76  lung]
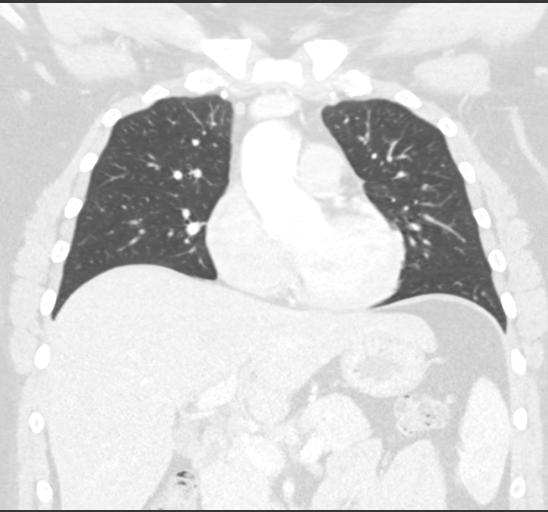
[im 46/76  lung]
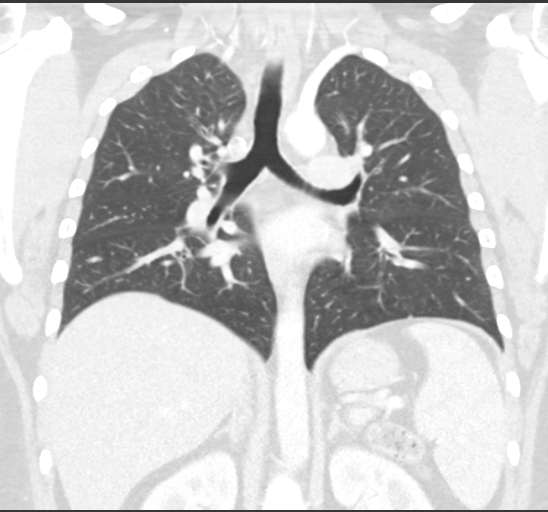

[15 of 36 positions shown; findings below may reference images not displayed]

FINDINGS: No evidence of mediastinal or hilar lymphadenopathy. No evidence of
axillary or chest wall lymphadenopathy. No evidence of pulmonary
mass or infiltrate. No central endobronchial lesion identified no
evidence of pleural or pericardial effusion.

Both adrenal glands are normal in appearance. No suspicious bone
lesions identified.
IMPRESSION: Negative.  No evidence of thoracic lymphadenopathy or mass.

## 2014-09-28 IMAGING — US US BIOPSY
1 series · 13 of 15 positions shown · non-contrast
Comparison: none

CLINICAL DATA: Palpable left supraclavicular lymph node with
previous ultrasound demonstrating maximal length of 13 mm.

[Series 1: us biopsy · 0.04mm/px · 13 of 15 slices shown]
[im 1/15]
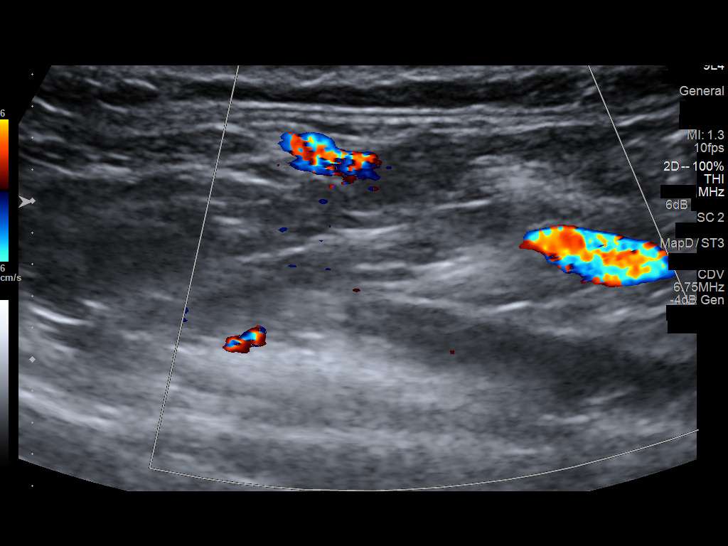
[im 2/15]
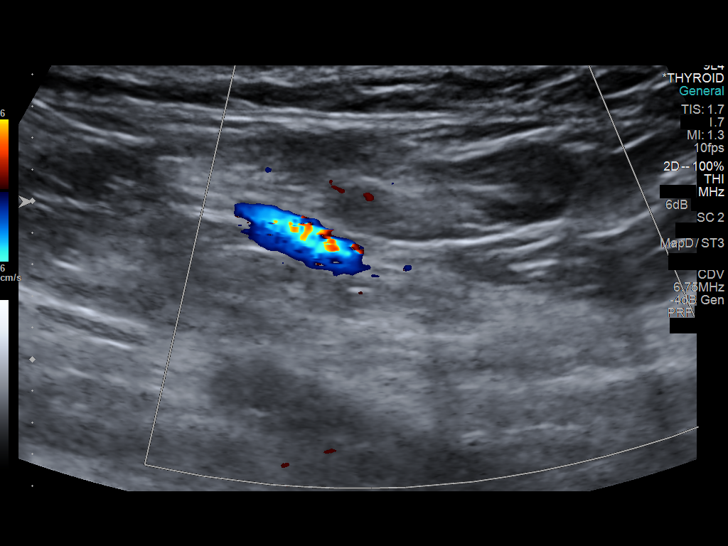
[im 3/15]
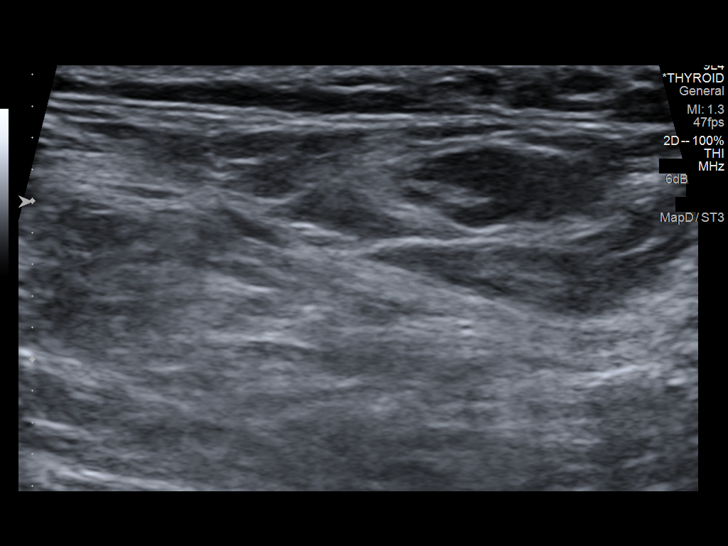
[im 5/15]
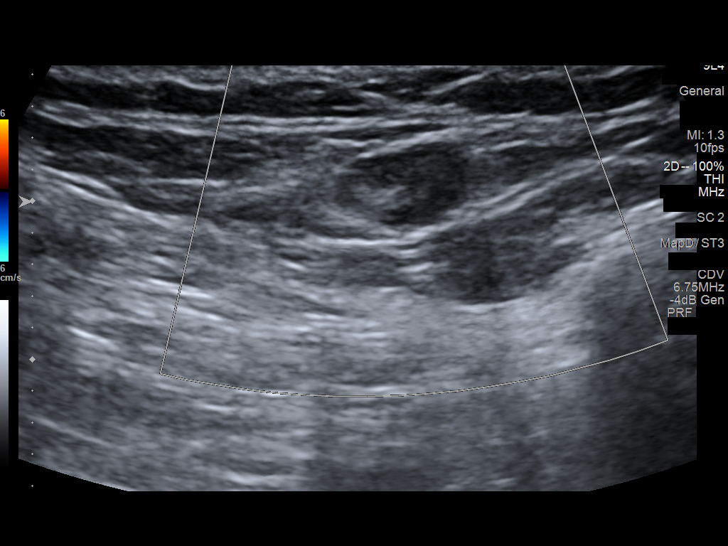
[im 6/15]
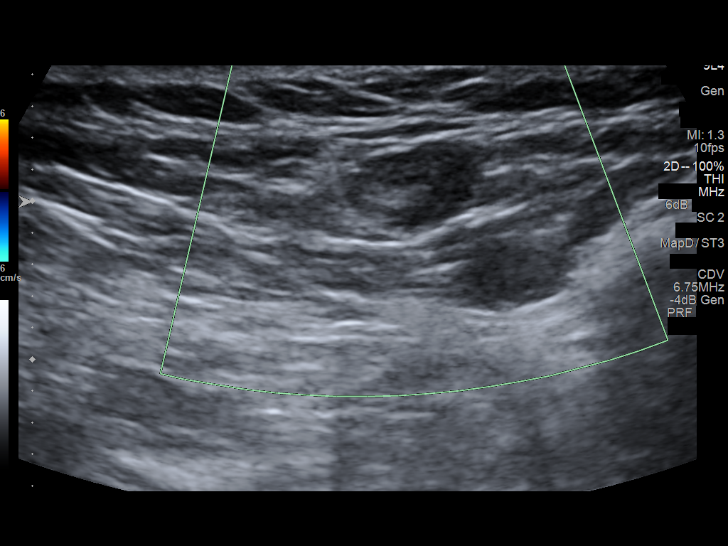
[im 7/15]
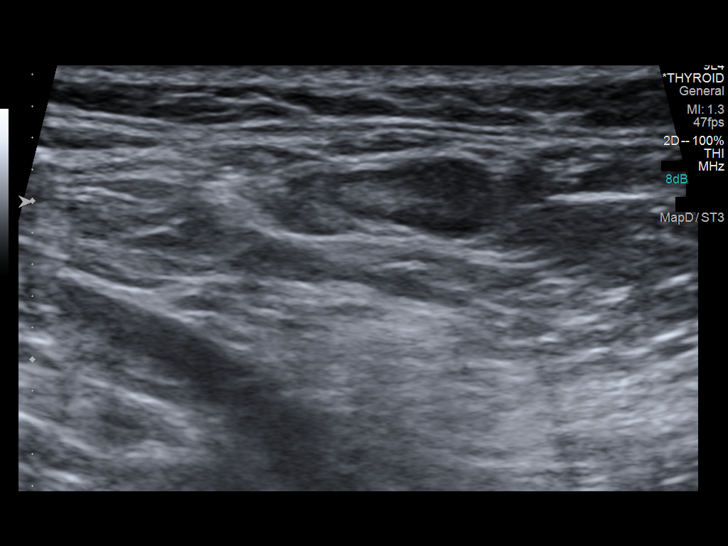
[im 8/15]
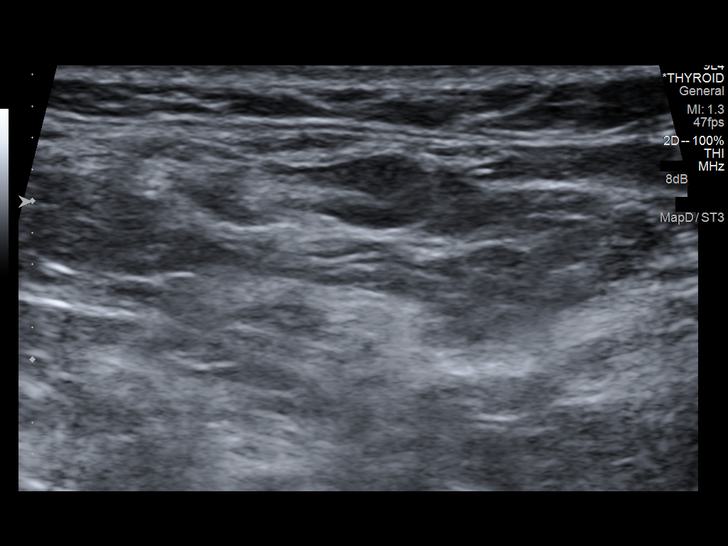
[im 9/15]
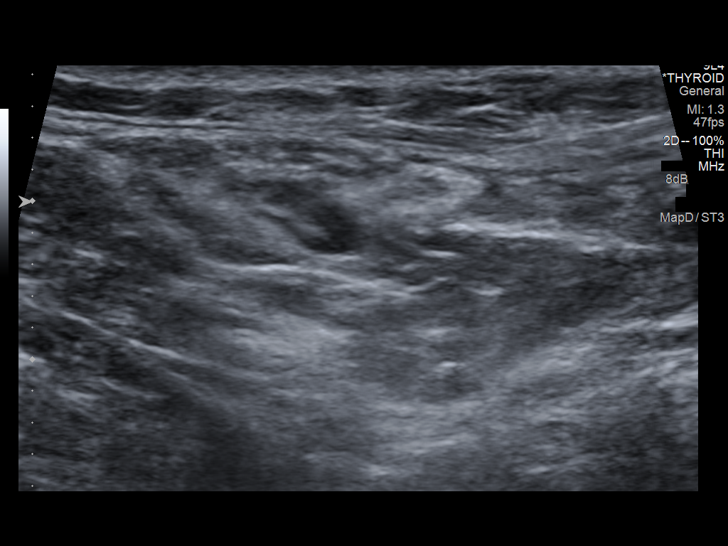
[im 10/15]
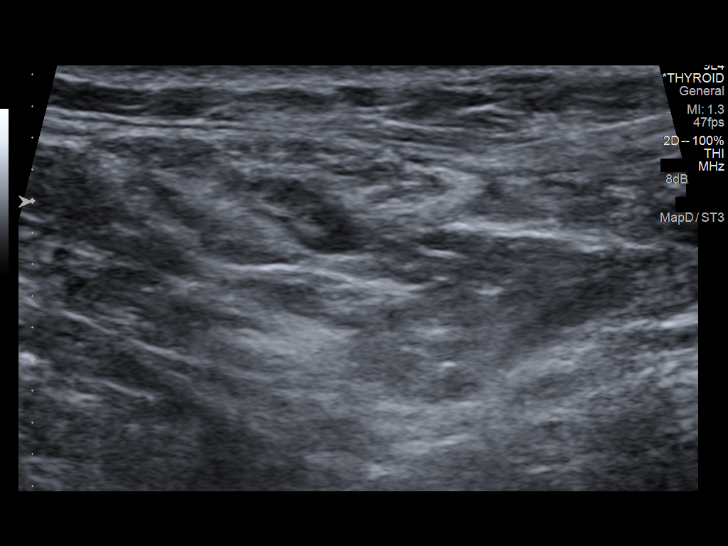
[im 11/15]
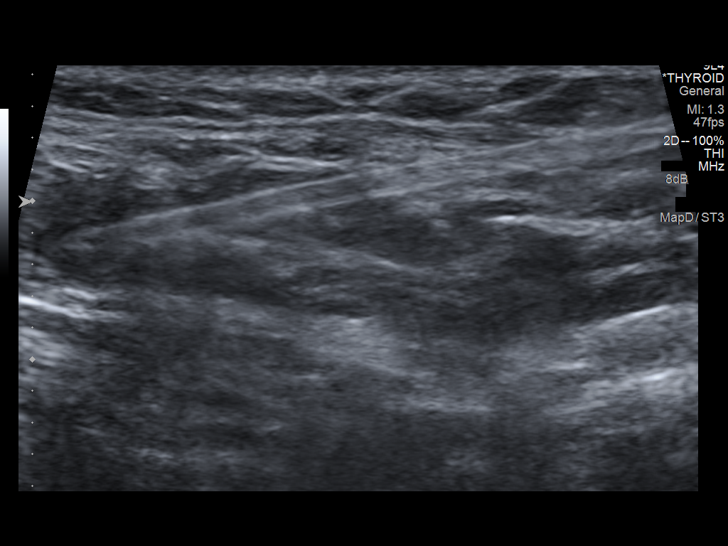
[im 13/15]
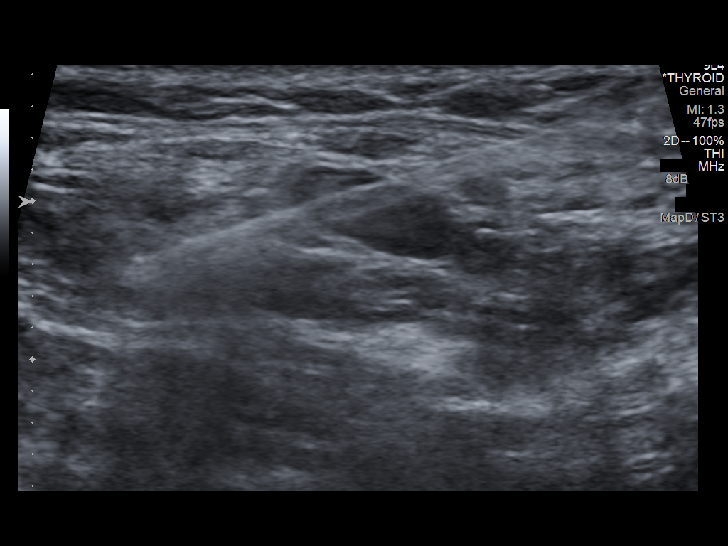
[im 14/15]
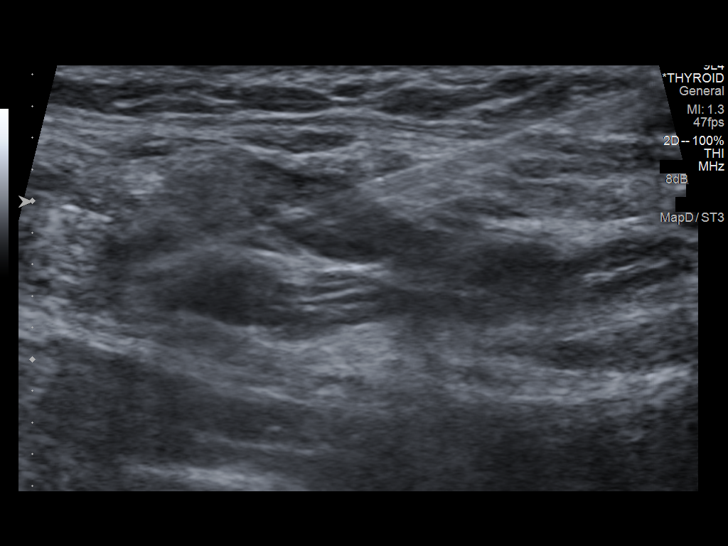
[im 15/15]
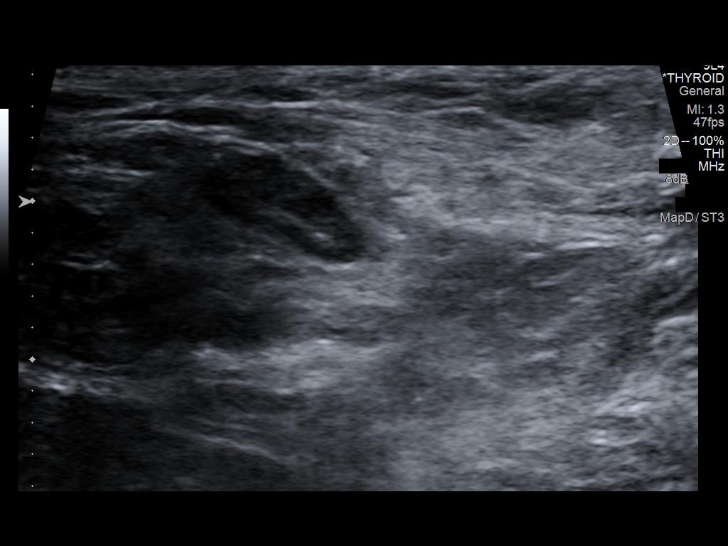

[13 of 15 positions shown; findings below may reference images not displayed]

EXAM:
ULTRASOUND GUIDED CORE BIOPSY OF LEFT SUPRACLAVICULAR LYMPH NODE

MEDICATIONS:
2.0 mg IV Versed; 75 mcg IV Fentanyl

Total Moderate Sedation Time: 10 min.

PROCEDURE:
The procedure, risks, benefits, and alternatives were explained to
the patient. Questions regarding the procedure were encouraged and
answered. The patient understands and consents to the procedure.

The left supraclavicular region was prepped with Betadine in a
sterile fashion, and a sterile drape was applied covering the
operative field. A sterile gown and sterile gloves were used for the
procedure. Local anesthesia was provided with 1% Lidocaine.

A total of 5 separate 18 gauge core biopsy samples were obtained
through different portions of the supraclavicular lymph node. Core
biopsy samples were submitted in saline.

COMPLICATIONS:
None.
FINDINGS: The previously imaged left supraclavicular lymph node was again
demonstrated by ultrasound. Maximal length is 13 mm. Fragmented
solid tissue was obtained.
IMPRESSION: Ultrasound-guided core biopsy performed of palpable left
supraclavicular lymph node.

## 2014-12-09 ENCOUNTER — Telehealth: Payer: Self-pay | Admitting: Critical Care Medicine

## 2014-12-09 NOTE — Telephone Encounter (Signed)
Spoke with pt. States that she is having increased coughing. PCP gave her Biaxin 500mg . She would like to make an appointment for Monday to see someone in case she's not better. This been scheduled with TP on 12/13/14 at 10am.

## 2014-12-13 ENCOUNTER — Ambulatory Visit: Payer: Self-pay | Admitting: Adult Health

## 2014-12-14 ENCOUNTER — Ambulatory Visit (INDEPENDENT_AMBULATORY_CARE_PROVIDER_SITE_OTHER): Payer: Self-pay | Admitting: Critical Care Medicine

## 2014-12-14 ENCOUNTER — Encounter: Payer: Self-pay | Admitting: *Deleted

## 2014-12-14 ENCOUNTER — Encounter: Payer: Self-pay | Admitting: Critical Care Medicine

## 2014-12-14 VITALS — BP 128/80 | HR 97 | Temp 99.5°F | Ht 64.0 in | Wt 218.4 lb

## 2014-12-14 DIAGNOSIS — R0982 Postnasal drip: Secondary | ICD-10-CM

## 2014-12-14 DIAGNOSIS — J4551 Severe persistent asthma with (acute) exacerbation: Secondary | ICD-10-CM

## 2014-12-14 DIAGNOSIS — K219 Gastro-esophageal reflux disease without esophagitis: Secondary | ICD-10-CM

## 2014-12-14 DIAGNOSIS — J309 Allergic rhinitis, unspecified: Secondary | ICD-10-CM

## 2014-12-14 DIAGNOSIS — J0191 Acute recurrent sinusitis, unspecified: Secondary | ICD-10-CM

## 2014-12-14 DIAGNOSIS — J4541 Moderate persistent asthma with (acute) exacerbation: Secondary | ICD-10-CM

## 2014-12-14 MED ORDER — MOMETASONE FUROATE 50 MCG/ACT NA SUSP: 2.0000 | Freq: Two times a day (BID) | NASAL | Status: DC

## 2014-12-14 MED ORDER — PREDNISONE 10 MG PO TABS: ORAL_TABLET | ORAL | Status: DC

## 2014-12-14 MED ORDER — BENZONATATE 100 MG PO CAPS: ORAL_CAPSULE | ORAL | Status: DC

## 2014-12-14 MED ORDER — BECLOMETHASONE DIPROPIONATE 80 MCG/ACT IN AERS: 2.0000 | INHALATION_SPRAY | Freq: Two times a day (BID) | RESPIRATORY_TRACT | Status: DC

## 2014-12-14 MED ORDER — HYDROCODONE-HOMATROPINE 5-1.5 MG/5ML PO SYRP
5.0000 mL | ORAL_SOLUTION | Freq: Four times a day (QID) | ORAL | Status: DC | PRN
Start: 2014-12-14 — End: 2014-12-22

## 2014-12-14 NOTE — Progress Notes (Signed)
Subjective:    Patient ID: Ellen Parsons, female    DOB: Jul 27, 1981, 34 y.o.   MRN: [id number]  HPI 12/14/2014 Chief Complaint  Patient presents with  . Acute Visit    c/o cough-same as last yr. Started 3 wks. ago.Restarted cough protocol. Started as nasal congestion now chest congestion,ears stopped up. Looses breath with coughing-can't get it up.Wheezing,midchest tightness.fever 100.3 1 wk. ago.pnd.On Biaxin from primary right now.  Cough returned three weeks ago.  Rx biaxin per pcp.  No pred pulse given.  On biaxin 7-8days, 10day supply given.  Notes pndrip and upper and lower mucus, cough is minimal after along cough spell.  Notes some wheezing. No CXR done. On Qvar two daily, on nasonex two puff daily.    PUL ASTHMA HISTORY 12/14/2014  Symptoms Throughout the day  Nighttime awakenings Often--7/wk  Interference with activity Extreme limitations  SABA use Several times/day  Exacerbations requiring oral steroids 0-1 / year     Review of Systems  Constitutional: Negative for unexpected weight change.  HENT: Negative for congestion, dental problem, mouth sores, nosebleeds, sinus pressure, sneezing, tinnitus, trouble swallowing and voice change.   Eyes: Negative for redness and itching.  Respiratory: Positive for chest tightness.   Cardiovascular: Negative for palpitations and leg swelling.  Gastrointestinal: Negative for nausea and vomiting.       Gerd sx better on PPI  Genitourinary: Negative for dysuria.  Musculoskeletal: Negative for joint swelling.  Hematological: Does not bruise/bleed easily.  Psychiatric/Behavioral: Negative for dysphoric mood. The patient is not nervous/anxious.        Objective:   Physical Exam Filed Vitals:   12/14/14 1037  BP: 128/80  Pulse: 97  Temp: 99.5 F (37.5 C)  TempSrc: Oral  Height: 5\' 4"  (1.626 m)  Weight: 218 lb 6.4 oz (99.066 kg)  SpO2: 97%    Gen: Pleasant, well-nourished, in no distress,  normal affect  ENT: No lesions,   mouth clear,  oropharynx clear,++_+postnasal drip  Neck: No JVD, no TMG, no carotid bruits  Lungs: No use of accessory muscles, no dullness to percussion, upper and lower airway wheeze  Cardiovascular: RRR, heart sounds normal, no murmur or gallops, no peripheral edema  Abdomen: soft and NT, no HSM,  BS normal  Musculoskeletal: No deformities, no cyanosis or clubbing  Neuro: alert, non focal  Skin: Warm, no lesions or rashes  No results found.       Assessment & Plan:   Asthma, moderate persistent Mod persistent asthma with flare with allergic ppt factor, rhinitis, gerd  Plan USe benzonatate and hycodan per cough protocol Increase Qvar two puff twice daily Increase nasonex two puff twice daily ea nare Prednisone 10mg  Take 4 for three days 3 for three days 2 for three days 1 for three days and stop Saline rinse Reflux diet Stay on omeprazole daily Finish biaxin Return 2 months      Updated Medication List Outpatient Encounter Prescriptions as of 12/14/2014  Medication Sig  . albuterol (PROAIR HFA) 108 (90 BASE) MCG/ACT inhaler Inhale 2 puffs into the lungs every 6 (six) hours as needed.  . Ascorbic Acid (VITAMIN C GUMMIE PO) Take by mouth daily.  . beclomethasone (QVAR) 80 MCG/ACT inhaler Inhale 2 puffs into the lungs 2 (two) times daily.  . clarithromycin (BIAXIN) 500 MG tablet Take 500 mg by mouth 2 (two) times daily.  . mometasone (NASONEX) 50 MCG/ACT nasal spray Place 2 sprays into the nose 2 (two) times daily.  . Multiple Vitamins-Minerals (  MULTI-VITAMIN GUMMIES PO) Take by mouth daily.  . naproxen sodium (ANAPROX) 220 MG tablet Take 220 mg by mouth. As needed  . omeprazole (PRILOSEC) 40 MG capsule Take 40 mg by mouth daily.  . [DISCONTINUED] beclomethasone (QVAR) 80 MCG/ACT inhaler Inhale 2 puffs into the lungs daily.  . [DISCONTINUED] mometasone (NASONEX) 50 MCG/ACT nasal spray Place 2 sprays into the nose daily.  . benzonatate (TESSALON) 100 MG capsule 1-2  every 6 hours per cough protocol  . HYDROcodone-homatropine (HYCODAN) 5-1.5 MG/5ML syrup Take 5 mLs by mouth every 6 (six) hours as needed for cough.  . predniSONE (DELTASONE) 10 MG tablet Take 4 for three days 3 for three days 2 for three days 1 for three days and stop  . [DISCONTINUED] benzonatate (TESSALON) 100 MG capsule Take 100-200 mg by mouth 2 (two) times daily as needed for cough.

## 2014-12-14 NOTE — Patient Instructions (Signed)
USe benzonatate and hycodan per cough protocol Increase Qvar two puff twice daily Increase nasonext two puff twice daily ea nare Prednisone 10mg  Take 4 for three days 3 for three days 2 for three days 1 for three days and stop Saline rinse Reflux diet Stay on omeprazole daily Finish biaxin Return 2 months

## 2014-12-15 NOTE — Assessment & Plan Note (Signed)
Mod persistent asthma with flare Plan USe benzonatate and hycodan per cough protocol Increase Qvar two puff twice daily Increase nasonext two puff twice daily ea nare Prednisone 10mg  Take 4 for three days 3 for three days 2 for three days 1 for three days and stop Saline rinse Reflux diet Stay on omeprazole daily Finish biaxin Return 2 months

## 2014-12-22 ENCOUNTER — Telehealth: Payer: Self-pay | Admitting: Critical Care Medicine

## 2014-12-22 MED ORDER — HYDROCODONE-HOMATROPINE 5-1.5 MG/5ML PO SYRP: 5.0000 mL | ORAL_SOLUTION | Freq: Four times a day (QID) | ORAL | Status: DC | PRN

## 2014-12-22 MED ORDER — FLUCONAZOLE 150 MG PO TABS: 150.0000 mg | ORAL_TABLET | Freq: Every day | ORAL | Status: DC

## 2014-12-22 MED ORDER — PREDNISONE 10 MG PO TABS: ORAL_TABLET | ORAL | Status: DC

## 2014-12-22 NOTE — Telephone Encounter (Signed)
Pt is aware that we will send in prednisone and get cough syrup rx ready. Prednisone has been sent in.  PW - please advise on Diflucan rx. Thanks.

## 2014-12-22 NOTE — Telephone Encounter (Signed)
Per PW >> okay for Diflucan 150mg  one daily #5.  Rx will be sent in.

## 2014-12-22 NOTE — Telephone Encounter (Signed)
Ok for refill prednisone 10mg  Take 4 for two days three for two days two for two days one for two days  Ok to refill cough syrup

## 2014-12-22 NOTE — Telephone Encounter (Signed)
Spoke with pt. States she feels she needs more prednisone. Reports SOB,chest tightness and cough, these symptoms are worse at night. She has been following the cough protocol and Tessalon is working well during the day. Cough syrup needs to be refilled, this helps her rest some at night. Since finishing Biaxin she has developed at yeast infection. Requests that Diflucan be sent in.  PW - please advise. Thanks.

## 2015-02-22 ENCOUNTER — Ambulatory Visit (INDEPENDENT_AMBULATORY_CARE_PROVIDER_SITE_OTHER): Payer: Self-pay | Admitting: Critical Care Medicine

## 2015-02-22 ENCOUNTER — Encounter: Payer: Self-pay | Admitting: Critical Care Medicine

## 2015-02-22 VITALS — BP 118/80 | HR 110 | Temp 99.2°F | Ht 64.0 in | Wt 217.2 lb

## 2015-02-22 DIAGNOSIS — J4541 Moderate persistent asthma with (acute) exacerbation: Secondary | ICD-10-CM

## 2015-02-22 DIAGNOSIS — J309 Allergic rhinitis, unspecified: Secondary | ICD-10-CM

## 2015-02-22 DIAGNOSIS — K219 Gastro-esophageal reflux disease without esophagitis: Secondary | ICD-10-CM

## 2015-02-22 DIAGNOSIS — R0982 Postnasal drip: Secondary | ICD-10-CM

## 2015-02-22 MED ORDER — BECLOMETHASONE DIPROPIONATE 80 MCG/ACT IN AERS: 2.0000 | INHALATION_SPRAY | Freq: Every day | RESPIRATORY_TRACT | Status: DC

## 2015-02-22 MED ORDER — BENZONATATE 100 MG PO CAPS: ORAL_CAPSULE | ORAL | Status: DC

## 2015-02-22 MED ORDER — MOMETASONE FUROATE 50 MCG/ACT NA SUSP: 2.0000 | Freq: Every day | NASAL | Status: DC

## 2015-02-22 MED ORDER — ALBUTEROL SULFATE HFA 108 (90 BASE) MCG/ACT IN AERS: 2.0000 | INHALATION_SPRAY | Freq: Four times a day (QID) | RESPIRATORY_TRACT | Status: DC | PRN

## 2015-02-22 NOTE — Progress Notes (Signed)
Subjective:    Patient ID: Ellen Parsons, female    DOB: 1980/12/11, 34 y.o.   MRN: [id number]  HPI 02/22/2015 Chief Complaint  Patient presents with  . 2 month follow up    Had food poisoning last week - cough worsened some during that time but is now starting to improve and is nonprod.  Wheezing mostly in the morning and evenings.  No chest tightness, CP, or SOB.  USe benzonatate and hycodan per cough protocol Increase Qvar two puff twice daily Increase nasonext two puff twice daily ea nare Prednisone  Take 4 for three days 3 for three days 2 for three days 1 for three days and stop Saline rinse Reflux diet Stay on omeprazole daily Finish biaxin  Recent case of food poisoning.  Issues with local restaurant.  Episode one week ago, symptoms for 3 days.    Cough is mild, worse with illness.  PCP rx illness and is better. Wheezing in early am and pm.   Pt denies any significant sore throat, nasal congestion or excess secretions,  No fever, chills, sweats, unintended weight loss, pleurtic or exertional chest pain, orthopnea PND, or leg swelling Pt notes more  increase in rescue therapy over baseline,PT denies waking up needing it or having any early am or nocturnal exacerbations of coughing/wheezing/or dyspnea. Pt also denies any obvious fluctuation in symptoms with  weather or environmental change or other alleviating or aggravating factors   Current Medications, Allergies, Complete Past Medical History, Past Surgical History, Family History, and Social History were reviewed in [facility] electronic medical record per todays encounter:  02/22/2015  Review of Systems  Constitutional: Negative.   HENT: Negative.  Negative for ear pain, postnasal drip, rhinorrhea, sinus pressure, sore throat, trouble swallowing and voice change.   Eyes: Negative.   Respiratory: Negative.  Negative for apnea, cough, choking, chest tightness, shortness of breath, wheezing and stridor.     Cardiovascular: Negative.  Negative for chest pain, palpitations and leg swelling.  Gastrointestinal: Negative.  Negative for nausea, vomiting, abdominal pain and abdominal distention.  Genitourinary: Negative.   Musculoskeletal: Negative.  Negative for myalgias and arthralgias.  Skin: Negative.  Negative for rash.  Allergic/Immunologic: Negative.  Negative for environmental allergies and food allergies.  Neurological: Negative.  Negative for dizziness, syncope, weakness and headaches.  Hematological: Negative.  Negative for adenopathy. Does not bruise/bleed easily.  Psychiatric/Behavioral: Negative.  Negative for sleep disturbance and agitation. The patient is not nervous/anxious.        Objective:   Physical Exam Filed Vitals:   02/22/15 1215  BP: 118/80  Pulse: 110  Temp: 99.2 F (37.3 C)  TempSrc: Oral  Height:  (1.626 m)  Weight: 217 lb 3.2 oz (98.521 kg)  SpO2: 96%    Gen: Pleasant, well-nourished, in no distress,  normal affect  ENT: No lesions,  mouth clear,  oropharynx clear, no postnasal drip  Neck: No JVD, no TMG, no carotid bruits  Lungs: No use of accessory muscles, no dullness to percussion, clear without rales or rhonchi  Cardiovascular: RRR, heart sounds normal, no murmur or gallops, no peripheral edema  Abdomen: soft and NT, no HSM,  BS normal  Musculoskeletal: No deformities, no cyanosis or clubbing  Neuro: alert, non focal  Skin: Warm, no lesions or rashes  No results found.        Assessment & Plan:  I personally reviewed all images and lab data in the [facility] system as well as any outside material  available during this office visit and agree with the  radiology impressions.   Asthma, moderate persistent Moderate persistent asthma stable at this time Note patient was able to reduce Qvar to daily dosing Ongoing reflux disease needs to be continuously addressed Plan REduce Qvar to two puff daily Reduce nasonex to two puff ea nare  daily Albuterol as needed Ok to stop omeprazole Follow reflux diet Return 4 months    Kahri was seen today for 2 month follow up.  Diagnoses and all orders for this visit:  Asthma, moderate persistent, with acute exacerbation  Gastroesophageal reflux disease without esophagitis  Allergic rhinitis with postnasal drip  Other orders -     beclomethasone (QVAR) 80 MCG/ACT inhaler; Inhale 2 puffs into the lungs daily. -     mometasone (NASONEX) 50 MCG/ACT nasal spray; Place 2 sprays into the nose daily. -     benzonatate (TESSALON) 100 MG capsule; 1-2 every 6 hours per cough protocol -     albuterol (PROAIR HFA) 108 (90 BASE) MCG/ACT inhaler; Inhale 2 puffs into the lungs every 6 (six) hours as needed.

## 2015-02-22 NOTE — Assessment & Plan Note (Signed)
Moderate persistent asthma stable at this time Note patient was able to reduce Qvar to daily dosing Ongoing reflux disease needs to be continuously addressed Plan REduce Qvar to two puff daily Reduce nasonex to two puff ea nare daily Albuterol as needed Ok to stop omeprazole Follow reflux diet Return 4 months

## 2015-02-22 NOTE — Patient Instructions (Addendum)
REduce Qvar to two puff daily Reduce nasonex to two puff ea nare daily Albuterol as needed Ok to stop omeprazole Follow reflux diet Return 4 months

## 2015-10-26 ENCOUNTER — Ambulatory Visit: Payer: Self-pay | Admitting: General Surgery

## 2015-10-26 NOTE — H&P (Signed)
Ellen Parsons 10/26/2015 11:39 AM Location: [facility] Patient #: [id number] DOB: June 27, 1981 Married / Language: English / Race: White Female  History of Present Illness Adolph Pollack MD; 10/26/2015 12:35 PM) The patient is a 35 year old female.   Note:She is referred by Nils Pyle, FNP for a consultation regarding upper abdominal pain and cholelithiasis. She states that for the past 6 months she's had some intermittent sharp epigastric pains that radiate around the right side. Recently however she had severe pain that was doubling her over this was associated with diaphoresis and some nausea. She went to urgent care. Ultrasound demonstrated cholelithiasis with no evidence of acute cholecystitis. Common bile duct diameter was 5 mm in size. She was referred here for further evaluation and treatment. She has a strong family history of gallbladder disease. Her husband is here with her.  Other Problems Gilmer Mor, CMA; 10/26/2015 11:39 AM) Gastroesophageal Reflux Disease Thyroid Disease  Past Surgical History Gilmer Mor, CMA; 10/26/2015 11:39 AM) Knee Surgery Left. Oral Surgery  Diagnostic Studies History Gilmer Mor, CMA; 10/26/2015 11:39 AM) Colonoscopy never Mammogram never Pap Smear 1-5 years ago  Allergies Lamar Laundry Bynum, CMA; 10/26/2015 11:41 AM) No Known Drug Allergies 10/26/2015  Medication History (Sonya Bynum, CMA; 10/26/2015 11:41 AM) Hydrocodone-Acetaminophen (5-325MG  Tablet, Oral) Active. Amoxicillin-Pot Clavulanate (875-125MG  Tablet, Oral) Active. Benzonatate (  Capsule, Oral) Active. Fluticasone Propionate (50MCG/ACT Suspension, Nasal) Active. Omeprazole (  Capsule DR, Oral) Active. PrednisoLONE Acetate (1% Suspension, Ophthalmic) Active. ProAir HFA (108 (90 Base)MCG/ACT Aerosol Soln, Inhalation) Active. Medications Reconciled  Social History Gilmer Mor, CMA; 10/26/2015 11:39 AM) Alcohol use Occasional alcohol  use. Caffeine use Carbonated beverages, Coffee, Tea. No drug use Tobacco use Current some day smoker.  Family History Gilmer Mor, CMA; 10/26/2015 11:39 AM) Heart Disease Father.  Pregnancy / Birth History Gilmer Mor, CMA; 10/26/2015 11:39 AM) Age at menarche 12 years. Gravida 0 Para 0 Regular periods     Review of Systems (Sonya Bynum CMA; 10/26/2015 11:39 AM) General Present- Fatigue and Weight Gain. Not Present- Appetite Loss, Chills, Fever, Night Sweats and Weight Loss. Skin Not Present- Change in Wart/Mole, Dryness, Hives, Jaundice, New Lesions, Non-Healing Wounds, Rash and Ulcer. HEENT Present- Seasonal Allergies. Not Present- Earache, Hearing Loss, Hoarseness, Nose Bleed, Oral Ulcers, Ringing in the Ears, Sinus Pain, Sore Throat, Visual Disturbances, Wears glasses/contact lenses and Yellow Eyes. Respiratory Not Present- Bloody sputum, Chronic Cough, Difficulty Breathing, Snoring and Wheezing. Breast Not Present- Breast Mass, Breast Pain, Nipple Discharge and Skin Changes. Cardiovascular Not Present- Chest Pain, Difficulty Breathing Lying Down, Leg Cramps, Palpitations, Rapid Heart Rate, Shortness of Breath and Swelling of Extremities. Gastrointestinal Present- Abdominal Pain, Bloating, Change in Bowel Habits and Indigestion. Not Present- Bloody Stool, Chronic diarrhea, Constipation, Difficulty Swallowing, Excessive gas, Gets full quickly at meals, Hemorrhoids, Nausea, Rectal Pain and Vomiting. Female Genitourinary Not Present- Frequency, Nocturia, Painful Urination, Pelvic Pain and Urgency. Musculoskeletal Not Present- Back Pain, Joint Pain, Joint Stiffness, Muscle Pain, Muscle Weakness and Swelling of Extremities. Neurological Present- Headaches. Not Present- Decreased Memory, Fainting, Numbness, Seizures, Tingling, Tremor, Trouble walking and Weakness. Psychiatric Not Present- Anxiety, Bipolar, Change in Sleep Pattern, Depression, Fearful and Frequent crying. Hematology  Not Present- Easy Bruising, Excessive bleeding, Gland problems, HIV and Persistent Infections.  Vitals (Sonya Bynum CMA; 10/26/2015 11:40 AM) 10/26/2015 11:40 AM Weight: 234 lb Height: 64in Body Surface Area: 2.09 m Body Mass Index: 40.17 kg/m  Temp.: 22F(Temporal)  Pulse: 76 (Regular)  BP: 126/80 (Sitting, Left Arm, Standard)  Physical Exam Adolph Pollack MD; 10/26/2015 12:37 PM)  The physical exam findings are as follows: Note:General: Obese female in NAD. Pleasant and cooperative.  HEENT: [place]/AT, no facial masses  EYES: EOMI, no icterus  CV: RRR, no murmur, no JVD.  CHEST: Breath sounds equal and clear. Respirations nonlabored.  ABDOMEN: Soft, obese, nontender, nondistended, no palpable masses.  SKIN: No jaundice .  NEUROLOGIC: Alert and oriented, answers questions appropriately, normal gait and station.  PSYCHIATRIC: Normal mood, affect , and behavior.    Assessment & Plan Adolph Pollack MD; 10/26/2015 12:38 PM)  SYMPTOMATIC CHOLELITHIASIS (K80.20) Impression: She's been having symptoms suggestive of this for about 6 months but had a severe episode recently.  Plan: Strict low fat diet recommended. Laparoscopic cholecystectomy with cholangiogram. I have explained the procedure, risks, and aftercare of cholecystectomy. Risks include but are not limited to bleeding, infection, wound problems, anesthesia, diarrhea, bile leak, injury to common bile duct/liver/intestine. She seems to understand and agrees to proceed.  Avel Peace, MD

## 2015-11-09 ENCOUNTER — Encounter (HOSPITAL_COMMUNITY): Payer: Self-pay

## 2015-11-09 ENCOUNTER — Encounter (HOSPITAL_COMMUNITY)
Admission: RE | Admit: 2015-11-09 | Discharge: 2015-11-09 | Disposition: A | Discharge status: Home / Self Care | Payer: BLUE CROSS/BLUE SHIELD | Source: Ambulatory Visit | Attending: General Surgery | Admitting: General Surgery

## 2015-11-09 DIAGNOSIS — Z01812 Encounter for preprocedural laboratory examination: Secondary | ICD-10-CM | POA: Insufficient documentation

## 2015-11-09 DIAGNOSIS — K8020 Calculus of gallbladder without cholecystitis without obstruction: Secondary | ICD-10-CM | POA: Insufficient documentation

## 2015-11-09 HISTORY — DX: Pneumonia, unspecified organism: J18.9

## 2015-11-09 HISTORY — DX: Hypothyroidism, unspecified: E03.9

## 2015-11-09 HISTORY — DX: Gastro-esophageal reflux disease without esophagitis: K21.9

## 2015-11-09 LAB — CBC WITH DIFFERENTIAL/PLATELET
BASOS PCT: 0 %
Basophils Absolute: 0 10*3/uL (ref 0.0–0.1)
EOS ABS: 0.4 10*3/uL (ref 0.0–0.7)
EOS PCT: 3 %
HCT: 46.8 % — ABNORMAL HIGH (ref 36.0–46.0)
Hemoglobin: 16.5 g/dL — ABNORMAL HIGH (ref 12.0–15.0)
LYMPHS ABS: 2.5 10*3/uL (ref 0.7–4.0)
Lymphocytes Relative: 20 %
MCH: 30.4 pg (ref 26.0–34.0)
MCHC: 35.3 g/dL (ref 30.0–36.0)
MCV: 86.3 fL (ref 78.0–100.0)
Monocytes Absolute: 0.6 10*3/uL (ref 0.1–1.0)
Monocytes Relative: 5 %
Neutro Abs: 9.2 10*3/uL — ABNORMAL HIGH (ref 1.7–7.7)
Neutrophils Relative %: 72 %
PLATELETS: 247 10*3/uL (ref 150–400)
RBC: 5.42 MIL/uL — AB (ref 3.87–5.11)
RDW: 12.9 % (ref 11.5–15.5)
WBC: 12.8 10*3/uL — AB (ref 4.0–10.5)

## 2015-11-09 LAB — COMPREHENSIVE METABOLIC PANEL
ALBUMIN: 3.6 g/dL (ref 3.5–5.0)
ALT: 18 U/L (ref 14–54)
ANION GAP: 11 (ref 5–15)
AST: 26 U/L (ref 15–41)
Alkaline Phosphatase: 84 U/L (ref 38–126)
BUN: 8 mg/dL (ref 6–20)
CHLORIDE: 103 mmol/L (ref 101–111)
CO2: 22 mmol/L (ref 22–32)
CREATININE: 1 mg/dL (ref 0.44–1.00)
Calcium: 9.2 mg/dL (ref 8.9–10.3)
GFR calc non Af Amer: >60 mL/min (ref >60)
Glucose, Bld: 103 mg/dL — ABNORMAL HIGH (ref 65–99)
Potassium: 4 mmol/L (ref 3.5–5.1)
SODIUM: 136 mmol/L (ref 135–145)
Total Bilirubin: 0.4 mg/dL (ref 0.3–1.2)
Total Protein: 6.7 g/dL (ref 6.5–8.1)

## 2015-11-09 LAB — HCG, SERUM, QUALITATIVE: PREG SERUM: NEGATIVE

## 2015-11-09 NOTE — Pre-Procedure Instructions (Signed)
Ellen Parsons  11/09/2015      [id number] Jacky Kindle [address] [place] [address] Phone: [phone] Fax: [phone]  [facility] DELIV[facility] - [place], [place] - [address] [place] [address] Phone: [phone] Fax: [phone]    Your procedure is scheduled on March 13  Report to [facility] Admitting at 530 A.M.  Call this number if you have problems the morning of surgery:  [phone]   Remember:  Do not eat food or drink liquids after midnight.  Take these medicines the morning of surgery with A SIP OF WATER albuterol (Proair HFA) inhaler if needed-bring with you on the day of surgery, omeprazole (Prilosec)   Stop taking Asprin, Ibuprofen, Advil, Motrin, Aleve, BC's, Goody's, Herbal medications, Fish Oil   Do not wear jewelry, make-up or nail polish.  Do not wear lotions, powders, or perfumes.  You may wear deodorant.  Do not shave 48 hours prior to surgery.  Men may shave face and neck.  Do not bring valuables to the hospital.  [facility] is not responsible for any belongings or valuables.  Contacts, dentures or bridgework may not be worn into surgery.  Leave your suitcase in the car.  After surgery it may be brought to your room.  For patients admitted to the hospital, discharge time will be determined by your treatment team.  Patients discharged the day of surgery will not be allowed to drive home.    Special instructions:  [facility] - Preparing for Surgery  Before surgery, you can play an important role.  Because skin is not sterile, your skin needs to be as free of germs as possible.  You can reduce the number of germs on you skin by washing with CHG (chlorahexidine gluconate) soap before surgery.  CHG is an antiseptic cleaner which kills germs and bonds with the skin to continue killing germs even after washing.  Please DO NOT use if you have an  allergy to CHG or antibacterial soaps.  If your skin becomes reddened/irritated stop using the CHG and inform your nurse when you arrive at Short Stay.  Do not shave (including legs and underarms) for at least 48 hours prior to the first CHG shower.  You may shave your face.  Please follow these instructions carefully:   1.  Shower with CHG Soap the night before surgery and the  morning of Surgery.  2.  If you choose to wash your hair, wash your hair first as usual with your  normal shampoo.  3.  After you shampoo, rinse your hair and body thoroughly to remove the  Shampoo.  4.  Use CHG as you would any other liquid soap.  You can apply chg directly  to the skin and wash gently with scrungie or a clean washcloth.  5.  Apply the CHG Soap to your body ONLY FROM THE NECK DOWN.   Do not use on open wounds or open sores.  Avoid contact with your eyes,  ears, mouth and genitals (private parts).  Wash genitals (private parts) with your normal soap.  6.  Wash thoroughly, paying special attention to the area where your surgery  will be performed.  7.  Thoroughly rinse your body with warm water from the neck down.  8.  DO NOT shower/wash with your normal soap after using and rinsing off  the CHG Soap.  9.  Pat yourself dry with a clean towel.            10.  Wear clean pajamas.            11.  Place clean sheets on your bed the night of your first shower and do not  sleep with pets.  Day of Surgery  Do not apply any lotions/deoderants the morning of surgery.  Please wear clean clothes to the hospital/surgery center.     Please read over the following fact sheets that you were given. Pain Booklet, Coughing and Deep Breathing and Surgical Site Infection Prevention

## 2015-11-09 NOTE — Progress Notes (Signed)
PCP is Dr Burnell BlanksMaura Hamrick Denies ever seeing a cardiologist. Denies ever having a card cath, stress test, Card cath, or echo.

## 2015-11-13 MED ORDER — DEXTROSE 5 % IV SOLN
3.0000 g | INTRAVENOUS | Status: DC
  Filled 2015-11-13: qty 3000

## 2015-11-13 MED ORDER — CEFAZOLIN SODIUM-DEXTROSE 2-3 GM-% IV SOLR
2.0000 g | INTRAVENOUS | Status: AC
  Administered 2015-11-14: 2 g via INTRAVENOUS
  Filled 2015-11-13 (×2): qty 50

## 2015-11-14 ENCOUNTER — Encounter (HOSPITAL_COMMUNITY): Payer: Self-pay | Admitting: *Deleted

## 2015-11-14 ENCOUNTER — Ambulatory Visit (HOSPITAL_COMMUNITY): Payer: BLUE CROSS/BLUE SHIELD | Admitting: Anesthesiology

## 2015-11-14 ENCOUNTER — Ambulatory Visit (HOSPITAL_COMMUNITY)
Admission: RE | Admit: 2015-11-14 | Discharge: 2015-11-14 | Disposition: A | Discharge status: Home / Self Care | Payer: BLUE CROSS/BLUE SHIELD | Source: Ambulatory Visit | Attending: General Surgery | Admitting: General Surgery

## 2015-11-14 ENCOUNTER — Encounter (HOSPITAL_COMMUNITY)
Admission: RE | Disposition: A | Discharge status: Home / Self Care | Payer: Self-pay | Source: Ambulatory Visit | Attending: General Surgery

## 2015-11-14 ENCOUNTER — Ambulatory Visit (HOSPITAL_COMMUNITY): Payer: BLUE CROSS/BLUE SHIELD

## 2015-11-14 DIAGNOSIS — F17200 Nicotine dependence, unspecified, uncomplicated: Secondary | ICD-10-CM | POA: Diagnosis not present

## 2015-11-14 DIAGNOSIS — K8010 Calculus of gallbladder with chronic cholecystitis without obstruction: Secondary | ICD-10-CM | POA: Diagnosis not present

## 2015-11-14 DIAGNOSIS — E039 Hypothyroidism, unspecified: Secondary | ICD-10-CM | POA: Diagnosis not present

## 2015-11-14 DIAGNOSIS — K8020 Calculus of gallbladder without cholecystitis without obstruction: Secondary | ICD-10-CM

## 2015-11-14 DIAGNOSIS — K219 Gastro-esophageal reflux disease without esophagitis: Secondary | ICD-10-CM | POA: Diagnosis not present

## 2015-11-14 HISTORY — PX: CHOLECYSTECTOMY: SHX55

## 2015-11-14 SURGERY — LAPAROSCOPIC CHOLECYSTECTOMY WITH INTRAOPERATIVE CHOLANGIOGRAM
Anesthesia: General | Site: Abdomen

## 2015-11-14 MED ORDER — ONDANSETRON HCL 4 MG PO TABS: 4.0000 mg | ORAL_TABLET | ORAL | Status: AC | PRN

## 2015-11-14 MED ORDER — PROPOFOL 10 MG/ML IV BOLUS
INTRAVENOUS | Status: AC
  Filled 2015-11-14: qty 20

## 2015-11-14 MED ORDER — DEXAMETHASONE SODIUM PHOSPHATE 4 MG/ML IJ SOLN
INTRAMUSCULAR | Status: DC | PRN
  Administered 2015-11-14: 8 mg via INTRAVENOUS

## 2015-11-14 MED ORDER — HYDROMORPHONE HCL 1 MG/ML IJ SOLN
INTRAMUSCULAR | Status: AC
  Filled 2015-11-14: qty 1

## 2015-11-14 MED ORDER — BUPIVACAINE-EPINEPHRINE 0.25% -1:200000 IJ SOLN
INTRAMUSCULAR | Status: DC | PRN
  Administered 2015-11-14: 21 mL

## 2015-11-14 MED ORDER — LACTATED RINGERS IV SOLN
INTRAVENOUS | Status: DC | PRN
  Administered 2015-11-14: 07:00:00 via INTRAVENOUS

## 2015-11-14 MED ORDER — FENTANYL CITRATE (PF) 250 MCG/5ML IJ SOLN
INTRAMUSCULAR | Status: AC
  Filled 2015-11-14: qty 5

## 2015-11-14 MED ORDER — OXYCODONE HCL 5 MG PO TABS
5.0000 mg | ORAL_TABLET | ORAL | Status: DC | PRN
  Administered 2015-11-14: 10 mg via ORAL

## 2015-11-14 MED ORDER — MIDAZOLAM HCL 2 MG/2ML IJ SOLN
INTRAMUSCULAR | Status: AC
  Filled 2015-11-14: qty 2

## 2015-11-14 MED ORDER — ALBUTEROL SULFATE HFA 108 (90 BASE) MCG/ACT IN AERS
INHALATION_SPRAY | RESPIRATORY_TRACT | Status: AC
  Filled 2015-11-14: qty 6.7

## 2015-11-14 MED ORDER — LIDOCAINE HCL (CARDIAC) 20 MG/ML IV SOLN
INTRAVENOUS | Status: DC | PRN
  Administered 2015-11-14: 60 mg via INTRAVENOUS

## 2015-11-14 MED ORDER — GLYCOPYRROLATE 0.2 MG/ML IJ SOLN
INTRAMUSCULAR | Status: DC | PRN
  Administered 2015-11-14: .8 mg via INTRAVENOUS

## 2015-11-14 MED ORDER — SODIUM CHLORIDE 0.9 % IR SOLN
Status: DC | PRN
  Administered 2015-11-14: 1000 mL

## 2015-11-14 MED ORDER — ONDANSETRON HCL 4 MG/2ML IJ SOLN
INTRAMUSCULAR | Status: DC | PRN
  Administered 2015-11-14: 4 mg via INTRAVENOUS

## 2015-11-14 MED ORDER — FENTANYL CITRATE (PF) 100 MCG/2ML IJ SOLN
INTRAMUSCULAR | Status: DC | PRN
  Administered 2015-11-14 (×3): 50 ug via INTRAVENOUS
  Administered 2015-11-14: 100 ug via INTRAVENOUS

## 2015-11-14 MED ORDER — ALBUTEROL SULFATE HFA 108 (90 BASE) MCG/ACT IN AERS
INHALATION_SPRAY | RESPIRATORY_TRACT | Status: DC | PRN
Start: 2015-11-14 — End: 2015-11-14
  Administered 2015-11-14: 3 via RESPIRATORY_TRACT

## 2015-11-14 MED ORDER — MIDAZOLAM HCL 5 MG/5ML IJ SOLN
INTRAMUSCULAR | Status: DC | PRN
  Administered 2015-11-14: 2 mg via INTRAVENOUS

## 2015-11-14 MED ORDER — OXYCODONE HCL 5 MG PO TABS: 5.0000 mg | ORAL_TABLET | ORAL | Status: DC | PRN

## 2015-11-14 MED ORDER — BUPIVACAINE-EPINEPHRINE (PF) 0.25% -1:200000 IJ SOLN
INTRAMUSCULAR | Status: AC
  Filled 2015-11-14: qty 30

## 2015-11-14 MED ORDER — PROMETHAZINE HCL 25 MG/ML IJ SOLN: 6.2500 mg | INTRAMUSCULAR | Status: DC | PRN

## 2015-11-14 MED ORDER — ROCURONIUM BROMIDE 100 MG/10ML IV SOLN
INTRAVENOUS | Status: DC | PRN
  Administered 2015-11-14: 40 mg via INTRAVENOUS

## 2015-11-14 MED ORDER — HYDROMORPHONE HCL 1 MG/ML IJ SOLN: 0.2500 mg | INTRAMUSCULAR | Status: DC | PRN

## 2015-11-14 MED ORDER — HYDROMORPHONE HCL 1 MG/ML IJ SOLN
0.2500 mg | INTRAMUSCULAR | Status: DC | PRN
  Administered 2015-11-14 (×3): 0.5 mg via INTRAVENOUS

## 2015-11-14 MED ORDER — ROCURONIUM BROMIDE 50 MG/5ML IV SOLN
INTRAVENOUS | Status: AC
  Filled 2015-11-14: qty 1

## 2015-11-14 MED ORDER — LIDOCAINE HCL (CARDIAC) 20 MG/ML IV SOLN
INTRAVENOUS | Status: AC
  Filled 2015-11-14: qty 5

## 2015-11-14 MED ORDER — PROPOFOL 10 MG/ML IV BOLUS
INTRAVENOUS | Status: DC | PRN
  Administered 2015-11-14: 170 mg via INTRAVENOUS

## 2015-11-14 MED ORDER — DEXAMETHASONE SODIUM PHOSPHATE 4 MG/ML IJ SOLN
INTRAMUSCULAR | Status: AC
  Filled 2015-11-14: qty 2

## 2015-11-14 MED ORDER — ONDANSETRON HCL 4 MG/2ML IJ SOLN
INTRAMUSCULAR | Status: AC
  Filled 2015-11-14: qty 2

## 2015-11-14 MED ORDER — NEOSTIGMINE METHYLSULFATE 10 MG/10ML IV SOLN
INTRAVENOUS | Status: DC | PRN
  Administered 2015-11-14: 5 mg via INTRAVENOUS

## 2015-11-14 MED ORDER — OXYCODONE HCL 5 MG PO TABS
ORAL_TABLET | ORAL | Status: AC
  Filled 2015-11-14: qty 2

## 2015-11-14 MED ORDER — 0.9 % SODIUM CHLORIDE (POUR BTL) OPTIME
TOPICAL | Status: DC | PRN
  Administered 2015-11-14: 1000 mL

## 2015-11-14 MED ORDER — SODIUM CHLORIDE 0.9 % IV SOLN
INTRAVENOUS | Status: DC | PRN
  Administered 2015-11-14: 12 mL

## 2015-11-14 SURGICAL SUPPLY — 49 items
APL SKNCLS STERI-STRIP NONHPOA (GAUZE/BANDAGES/DRESSINGS) ×1
APPLIER CLIP 5 13 M/L LIGAMAX5 (MISCELLANEOUS) ×3
APR CLP MED LRG 5 ANG JAW (MISCELLANEOUS) ×1
BAG SPEC RTRVL LRG 6X4 10 (ENDOMECHANICALS) ×1
BENZOIN TINCTURE PRP APPL 2/3 (GAUZE/BANDAGES/DRESSINGS) ×3 IMPLANT
CANISTER SUCTION 2500CC (MISCELLANEOUS) ×3 IMPLANT
CATH REDDICK CHOLANGI 4FR 50CM (CATHETERS) ×3 IMPLANT
CHLORAPREP W/TINT 26ML (MISCELLANEOUS) ×3 IMPLANT
CLIP APPLIE 5 13 M/L LIGAMAX5 (MISCELLANEOUS) ×1 IMPLANT
CLOSURE WOUND 1/2 X4 (GAUZE/BANDAGES/DRESSINGS) ×1
COVER MAYO STAND STRL (DRAPES) ×3 IMPLANT
COVER SURGICAL LIGHT HANDLE (MISCELLANEOUS) ×3 IMPLANT
DRAPE C-ARM 42X72 X-RAY (DRAPES) ×3 IMPLANT
DRSG TEGADERM 2-3/8X2-3/4 SM (GAUZE/BANDAGES/DRESSINGS) ×12 IMPLANT
ELECT REM PT RETURN 9FT ADLT (ELECTROSURGICAL) ×3
ELECTRODE REM PT RTRN 9FT ADLT (ELECTROSURGICAL) ×1 IMPLANT
GAUZE SPONGE 2X2 8PLY STRL LF (GAUZE/BANDAGES/DRESSINGS) ×1 IMPLANT
GLOVE BIO SURGEON STRL SZ8 (GLOVE) ×2 IMPLANT
GLOVE BIOGEL PI IND STRL 8 (GLOVE) ×1 IMPLANT
GLOVE BIOGEL PI IND STRL 8.5 (GLOVE) IMPLANT
GLOVE BIOGEL PI INDICATOR 8 (GLOVE) ×4
GLOVE BIOGEL PI INDICATOR 8.5 (GLOVE) ×2
GLOVE ECLIPSE 8.0 STRL XLNG CF (GLOVE) ×3 IMPLANT
GLOVE SURG ORTHO 8.0 STRL STRW (GLOVE) ×2 IMPLANT
GLOVE SURG SS PI 8.0 STRL IVOR (GLOVE) ×2 IMPLANT
GOWN STRL REUS W/ TWL LRG LVL3 (GOWN DISPOSABLE) ×3 IMPLANT
GOWN STRL REUS W/ TWL XL LVL3 (GOWN DISPOSABLE) IMPLANT
GOWN STRL REUS W/TWL LRG LVL3 (GOWN DISPOSABLE) ×3
GOWN STRL REUS W/TWL XL LVL3 (GOWN DISPOSABLE) ×9
IV CATH 14GX2 1/4 (CATHETERS) ×3 IMPLANT
KIT BASIN OR (CUSTOM PROCEDURE TRAY) ×3 IMPLANT
KIT ROOM TURNOVER OR (KITS) ×3 IMPLANT
NS IRRIG 1000ML POUR BTL (IV SOLUTION) ×3 IMPLANT
PAD ARMBOARD 7.5X6 YLW CONV (MISCELLANEOUS) ×3 IMPLANT
POUCH SPECIMEN RETRIEVAL 10MM (ENDOMECHANICALS) ×3 IMPLANT
SCISSORS LAP 5X35 DISP (ENDOMECHANICALS) ×3 IMPLANT
SET IRRIG TUBING LAPAROSCOPIC (IRRIGATION / IRRIGATOR) ×3 IMPLANT
SLEEVE ENDOPATH XCEL 5M (ENDOMECHANICALS) ×6 IMPLANT
SPECIMEN JAR SMALL (MISCELLANEOUS) ×3 IMPLANT
SPONGE GAUZE 2X2 STER 10/PKG (GAUZE/BANDAGES/DRESSINGS) ×2
STRIP CLOSURE SKIN 1/2X4 (GAUZE/BANDAGES/DRESSINGS) ×2 IMPLANT
SUT MON AB 4-0 PC3 18 (SUTURE) ×3 IMPLANT
TOWEL OR 17X24 6PK STRL BLUE (TOWEL DISPOSABLE) ×1 IMPLANT
TOWEL OR 17X26 10 PK STRL BLUE (TOWEL DISPOSABLE) ×3 IMPLANT
TRAY LAPAROSCOPIC MC (CUSTOM PROCEDURE TRAY) ×3 IMPLANT
TROCAR XCEL BLUNT TIP 100MML (ENDOMECHANICALS) ×3 IMPLANT
TROCAR XCEL NON-BLD 11X100MML (ENDOMECHANICALS) IMPLANT
TROCAR XCEL NON-BLD 5MMX100MML (ENDOMECHANICALS) ×3 IMPLANT
TUBING INSUFFLATION (TUBING) ×3 IMPLANT

## 2015-11-14 NOTE — Progress Notes (Signed)
Voided incont in bed qs pt cleaned and dry

## 2015-11-14 NOTE — Discharge Instructions (Signed)
LAPAROSCOPIC GALLBLADDER SURGERY: POST OP INSTRUCTIONS  1. DIET: Follow a liquid diet the first 48 hours after arrival home, such as soup, liquids, crackers, etc.  Be sure to include lots of fluids daily.  Avoid fast food or heavy meals as your are more likely to get nauseated.  Eat a low fat starting 2 days after the surgery.   2. Take your usually prescribed home medications unless otherwise directed. 3. PAIN CONTROL: a. Pain is best controlled by a usual combination of three different methods TOGETHER: i. Ice/Heat ii. Over the counter pain medication iii. Prescription pain medication b. Most patients will experience some swelling and bruising around the incisions.  Ice packs or heating pads (30-60 minutes up to 6 times a day) will help. Use ice for the first few days to help decrease swelling and bruising, then switch to heat to help relax tight/sore spots and speed recovery.  Some people prefer to use ice alone, heat alone, alternating between ice & heat.  Experiment to what works for you.  Swelling and bruising can take several weeks to resolve.   c. It is helpful to take an over-the-counter pain medication regularly for the first few weeks.  Choose one of the following that works best for you: i. Naproxen (Aleve, etc)  Two 220mg  tabs twice a day ii. Ibuprofen (Advil, etc) Three 200mg  tabs four times a day (every meal & bedtime) iii. Acetaminophen (Tylenol, etc) 500-650mg  four times a day (every meal & bedtime) d. A  prescription for pain medication (such as oxycodone, hydrocodone, etc) should be given to you upon discharge.  Take your pain medication as prescribed.  i. If you are having problems/concerns with the prescription medicine (does not control pain, nausea, vomiting, rash, itching, etc), please call us [phone] to see if we need to switch you to a different pain medicine that will work better for you and/or control your side effect better. ii. If you need a refill on your pain  medication, please contact your pharmacy.  They will contact our office to request authorization. Prescriptions will not be filled after 5 pm or on week-ends. 4. Avoid getting constipated.  Between the surgery and the pain medications, it is common to experience some constipation.  Increasing fluid intake and taking a fiber supplement (such as Metamucil, Citrucel, FiberCon, MiraLax, etc) 1-2 times a day regularly will usually help prevent this problem from occurring.  A mild laxative (prune juice, Milk of Magnesia, MiraLax, etc) should be taken according to package directions if there are no bowel movements after 48 hours.   5. Watch out for diarrhea.  If you have many loose bowel movements, simplify your diet to bland foods & liquids for a few days.  Stop any stool softeners and decrease your fiber supplement.  Switching to mild anti-diarrheal medications (Kayopectate, Pepto Bismol) can help.  If this worsens or does not improve, please call us. 6. Wash / shower every day.  You may shower over the dressings as they are waterproof.  Continue to shower over incision(s) after the dressing is off. 7. Remove your waterproof bandages 3 days after surgery.  You may leave the incision open to air.  You may replace a dressing/Band-Aid to cover the incision for comfort if you wish.  8. ACTIVITIES as tolerated:   a. You may resume regular (light) daily activities beginning the next day--such as daily self-care, walking, climbing stairs--gradually increasing light activities as tolerated.  No heavy lifting (over 10 pounds), straining, or  intense activities for 2 weeks. °b. DO NOT PUSH THROUGH PAIN.  Let pain be your guide: If it hurts to do something, don't do it.  Pain is your body warning you to avoid that activity for another week until the pain goes down. °c. You may drive when you are no longer taking prescription pain medication, you can comfortably wear a seatbelt, and you can safely maneuver your car and apply  brakes. °d. You may have sexual intercourse when it is comfortable.  °9. FOLLOW UP in our office °a. Please call [facility] at [phone] to set up an appointment to see your surgeon in the office for a follow-up appointment approximately 2-3 weeks after your surgery. °b. Make sure that you call for this appointment the day you arrive home to insure a convenient appointment time. °10. IF YOU HAVE DISABILITY OR FAMILY LEAVE FORMS, BRING THEM TO THE OFFICE FOR PROCESSING.  DO NOT GIVE THEM TO YOUR DOCTOR. ° °11.  Return to work/school:  Desk work/light activities in 5-7 days, full duty/activities in 2 weeks if pain-free. ° ° °WHEN TO CALL US [phone]: °1. Poor pain control °2. Reactions / problems with new medications (rash/itching, nausea, etc)  °3. Fever over 101.5 F (38.5 C) °4. Inability to urinate °5. Nausea and/or vomiting °6. Worsening swelling or bruising °7. Continued bleeding from incision. °8. Increased pain, redness, or drainage from the incision ° ° The clinic staff is available to answer your questions during regular business hours (8:30am-5pm).  Please don’t hesitate to call and ask to speak to one of our nurses for clinical concerns.  ° If you have a medical emergency, go to the nearest emergency room or call 911. ° A surgeon from [facility] is always on call at the hospitals ° ° °[facility] °[address], [address], [place], [place]  [address] ? °MAIN: [phone] ? TOLL FREE: [phone] ?  °FAX [phone] °[email or web address] ° °

## 2015-11-14 NOTE — Anesthesia Postprocedure Evaluation (Signed)
Anesthesia Post Note  Patient: [name]  Procedure(s) Performed: Procedure(s) (LRB): LAPAROSCOPIC CHOLECYSTECTOMY WITH INTRAOPERATIVE CHOLANGIOGRAM (N/A)  Patient location during evaluation: PACU Anesthesia Type: General Level of consciousness: awake Pain management: pain level controlled Vital Signs Assessment: post-procedure vital signs reviewed and stable Respiratory status: spontaneous breathing Cardiovascular status: stable Anesthetic complications: no    Last Vitals:  Filed Vitals:   11/14/15 1015 11/14/15 1023  BP: 108/84 115/80  Pulse: 70 66  Temp: 36.7 C   Resp: 12     Last Pain:  Filed Vitals:   11/14/15 1028  PainSc: 2                  EDWARDS,Eliott Amparan

## 2015-11-14 NOTE — Op Note (Signed)
OPERATIVE NOTE-LAPAROSCOPIC CHOLECYSTECTOMY  Preoperative diagnosis:  Symptomatic cholelithiasis  Postoperative diagnosis:  Same  Procedure: Laparoscopic cholecystectomy with cholangiogram.  Surgeon: Avel Peaceodd Aamira Bischoff, M.D.  Asst.:  Darnell Levelodd Gerkin, M.D.  Anesthesia: General  Indication:   This is a 35 year old female with symptomatic cholelithiasis who presents for elective cholecystectomy.  Technique: She was brought to the operating room, placed supine on the operating table, and a general anesthetic was administered.  The abdominal wall was then sterilely prepped and draped. Local anesthetic (Marcaine) was infiltrated in the subumbilical region. A small subumbilical incision was made through the skin, subcutaneous tissue, fascia, and peritoneum entering the peritoneal cavity under direct vision. A pursestring suture of 0 Vicryl was placed around the edges of the fascia. A Hassan trocar was introduced into the peritoneal cavity and a pneumoperitoneum was created by insufflation of carbon dioxide gas. The laparoscope was introduced into the trocar and no underlying bleeding or organ injury was noted. She was then placed in the reverse Trendelenburg position with the right side tilted slightly up.  Three 5 mm trocars were then placed into the abdominal cavity under laparoscopic vision. One in the epigastric area, and 2 in the right upper quadrant area. The gallbladder was visualized and the fundus was grasped and retracted toward the right shoulder.  The infundibulum was mobilized with dissection close to the gallbladder and retracted laterally. The cystic duct was identified and a window was created around it. An anterior branch of the cystic artery was also identified and a window was created around it. The critical view was achieved. A clip was placed at the neck of the gallbladder. A small incision was made in the cystic duct. A cholangiocatheter was introduced through the anterior abdominal wall  and placed in the cystic duct. A intraoperative cholangiogram was then performed.  Under real-time fluoroscopy, dilute contrast was injected into the cystic duct.  The common hepatic duct, the right and left hepatic ducts, and the common duct were all visualized. Contrast drained into the duodenum without obvious evidence of any obstructing ductal lesion. The final report is pending the Radiologist's interpretation.  The cholangiocatheter was removed, the cystic duct was clipped 3 times on the biliary side, and then the cystic duct was divided sharply. No bile leak was noted from the cystic duct stump.  The anterior branch of the cystic artery was then clipped and divided. The posterior branch of the cystic artery was identified, isolated, clipped and divided. Following this the gallbladder was dissected free from the liver using electrocautery. The gallbladder was then placed in a retrieval bag and removed from the abdominal cavity through the subumbilical incision.  The gallbladder fossa was inspected, irrigated, and bleeding was controlled with electrocautery. Inspection showed that hemostasis was adequate and there was no evidence of bile leak.  The irrigation fluid was evacuated as much as possible.  The subumbilical trocar was removed and the fascial defect was closed by tightening and tying down the pursestring suture under laparoscopic vision.  The remaining trocars were removed and the pneumoperitoneum was released. The skin incisions were closed with 4-0 Monocryl subcuticular stitches. Steri-Strips and sterile dressings were applied.  The procedure was well-tolerated without any apparent complications. She was taken to the recovery room in satisfactory condition.

## 2015-11-14 NOTE — H&P (View-Only) (Signed)
Ellen Parsons 10/26/2015 11:39 AM Location: [facility] Patient #: [id number] DOB: 02/03/1981 Married / Language: English / Race: White Female  History of Present Illness Adolph Pollack(Renette Hsu J. Kenisha Lynds MD; 10/26/2015 12:35 PM) The patient is a 35 year old female.   Note:She is referred by Nils PyleWilliam Oxford, FNP for a consultation regarding upper abdominal pain and cholelithiasis. She states that for the past 6 months she's had some intermittent sharp epigastric pains that radiate around the right side. Recently however she had severe pain that was doubling her over this was associated with diaphoresis and some nausea. She went to urgent care. Ultrasound demonstrated cholelithiasis with no evidence of acute cholecystitis. Common bile duct diameter was 5 mm in size. She was referred here for further evaluation and treatment. She has a strong family history of gallbladder disease. Her husband is here with her.  Other Problems Gilmer Mor(Sonya Bynum, CMA; 10/26/2015 11:39 AM) Gastroesophageal Reflux Disease Thyroid Disease  Past Surgical History Gilmer Mor(Sonya Bynum, CMA; 10/26/2015 11:39 AM) Knee Surgery Left. Oral Surgery  Diagnostic Studies History Gilmer Mor(Sonya Bynum, CMA; 10/26/2015 11:39 AM) Colonoscopy never Mammogram never Pap Smear 1-5 years ago  Allergies Lamar Laundry(Sonya Bynum, CMA; 10/26/2015 11:41 AM) No Known Drug Allergies 10/26/2015  Medication History (Sonya Bynum, CMA; 10/26/2015 11:41 AM) Hydrocodone-Acetaminophen (5-325MG  Tablet, Oral) Active. Amoxicillin-Pot Clavulanate (875-125MG  Tablet, Oral) Active. Benzonatate (100MG  Capsule, Oral) Active. Fluticasone Propionate (50MCG/ACT Suspension, Nasal) Active. Omeprazole (40MG  Capsule DR, Oral) Active. PrednisoLONE Acetate (1% Suspension, Ophthalmic) Active. ProAir HFA (108 (90 Base)MCG/ACT Aerosol Soln, Inhalation) Active. Medications Reconciled  Social History Gilmer Mor(Sonya Bynum, CMA; 10/26/2015 11:39 AM) Alcohol use Occasional alcohol  use. Caffeine use Carbonated beverages, Coffee, Tea. No drug use Tobacco use Current some day smoker.  Family History Gilmer Mor(Sonya Bynum, CMA; 10/26/2015 11:39 AM) Heart Disease Father.  Pregnancy / Birth History Gilmer Mor(Sonya Bynum, CMA; 10/26/2015 11:39 AM) Age at menarche 12 years. Gravida 0 Para 0 Regular periods     Review of Systems (Sonya Bynum CMA; 10/26/2015 11:39 AM) General Present- Fatigue and Weight Gain. Not Present- Appetite Loss, Chills, Fever, Night Sweats and Weight Loss. Skin Not Present- Change in Wart/Mole, Dryness, Hives, Jaundice, New Lesions, Non-Healing Wounds, Rash and Ulcer. HEENT Present- Seasonal Allergies. Not Present- Earache, Hearing Loss, Hoarseness, Nose Bleed, Oral Ulcers, Ringing in the Ears, Sinus Pain, Sore Throat, Visual Disturbances, Wears glasses/contact lenses and Yellow Eyes. Respiratory Not Present- Bloody sputum, Chronic Cough, Difficulty Breathing, Snoring and Wheezing. Breast Not Present- Breast Mass, Breast Pain, Nipple Discharge and Skin Changes. Cardiovascular Not Present- Chest Pain, Difficulty Breathing Lying Down, Leg Cramps, Palpitations, Rapid Heart Rate, Shortness of Breath and Swelling of Extremities. Gastrointestinal Present- Abdominal Pain, Bloating, Change in Bowel Habits and Indigestion. Not Present- Bloody Stool, Chronic diarrhea, Constipation, Difficulty Swallowing, Excessive gas, Gets full quickly at meals, Hemorrhoids, Nausea, Rectal Pain and Vomiting. Female Genitourinary Not Present- Frequency, Nocturia, Painful Urination, Pelvic Pain and Urgency. Musculoskeletal Not Present- Back Pain, Joint Pain, Joint Stiffness, Muscle Pain, Muscle Weakness and Swelling of Extremities. Neurological Present- Headaches. Not Present- Decreased Memory, Fainting, Numbness, Seizures, Tingling, Tremor, Trouble walking and Weakness. Psychiatric Not Present- Anxiety, Bipolar, Change in Sleep Pattern, Depression, Fearful and Frequent crying. Hematology  Not Present- Easy Bruising, Excessive bleeding, Gland problems, HIV and Persistent Infections.  Vitals (Sonya Bynum CMA; 10/26/2015 11:40 AM) 10/26/2015 11:40 AM Weight: 234 lb Height: 64in Body Surface Area: 2.09 m Body Mass Index: 40.17 kg/m  Temp.: 89F(Temporal)  Pulse: 76 (Regular)  BP: 126/80 (Sitting, Left Arm, Standard)  Physical Exam (Ashling Roane J. Shaneice Barsanti MD; 10/26/2015 12:37 PM)  The physical exam findings are as follows: Note:General: Obese female in NAD. Pleasant and cooperative.  HEENT: /AT, no facial masses  EYES: EOMI, no icterus  CV: RRR, no murmur, no JVD.  CHEST: Breath sounds equal and clear. Respirations nonlabored.  ABDOMEN: Soft, obese, nontender, nondistended, no palpable masses.  SKIN: No jaundice .  NEUROLOGIC: Alert and oriented, answers questions appropriately, normal gait and station.  PSYCHIATRIC: Normal mood, affect , and behavior.    Assessment & Plan (Daiel Strohecker J. Burlie Cajamarca MD; 10/26/2015 12:38 PM)  SYMPTOMATIC CHOLELITHIASIS (K80.20) Impression: She's been having symptoms suggestive of this for about 6 months but had a severe episode recently.  Plan: Strict low fat diet recommended. Laparoscopic cholecystectomy with cholangiogram. I have explained the procedure, risks, and aftercare of cholecystectomy. Risks include but are not limited to bleeding, infection, wound problems, anesthesia, diarrhea, bile leak, injury to common bile duct/liver/intestine. She seems to understand and agrees to proceed.  Shanan Mcmiller, MD 

## 2015-11-14 NOTE — Interval H&P Note (Signed)
History and Physical Interval Note:  11/14/2015 7:23 AM  Ellen Parsons  has presented today for surgery, with the diagnosis of Gallstones  The various methods of treatment have been discussed with the patient and family. After consideration of risks, benefits and other options for treatment, the patient has consented to  Procedure(s): LAPAROSCOPIC CHOLECYSTECTOMY WITH INTRAOPERATIVE CHOLANGIOGRAM (N/A) as a surgical intervention .  The patient's history has been reviewed, patient examined, no change in status, stable for surgery.  I have reviewed the patient's chart and labs.  Questions were answered to the patient's satisfaction.     Crissie Aloi JShela Commons

## 2015-11-14 NOTE — Transfer of Care (Signed)
Immediate Anesthesia Transfer of Care Note  Patient: Ellen Parsons  Procedure(s) Performed: Procedure(s): LAPAROSCOPIC CHOLECYSTECTOMY WITH INTRAOPERATIVE CHOLANGIOGRAM (N/A)  Patient Location: PACU  Anesthesia Type:General  Level of Consciousness: awake, alert , oriented and patient cooperative  Airway & Oxygen Therapy: Patient Spontanous Breathing and Patient connected to nasal cannula oxygen  Post-op Assessment: Report given to RN and Post -op Vital signs reviewed and stable  Post vital signs: Reviewed and stable  Last Vitals:  Filed Vitals:   11/14/15 0548  BP: 123/78  Pulse: 87  Temp: 36.6 C  Resp: 20    Complications: No apparent anesthesia complications

## 2015-11-14 NOTE — Anesthesia Preprocedure Evaluation (Addendum)
Anesthesia Evaluation  Patient identified by MRN, date of birth, ID band Patient awake    Reviewed: Allergy & Precautions, NPO status   Airway Mallampati: II  TM Distance: >3 FB Neck ROM: Full    Dental   Pulmonary asthma , pneumonia, former smoker,    breath sounds clear to auscultation       Cardiovascular negative cardio ROS   Rhythm:Regular Rate:Normal     Neuro/Psych  Headaches,    GI/Hepatic Neg liver ROS, GERD  ,  Endo/Other  Hypothyroidism   Renal/GU negative Renal ROS     Musculoskeletal   Abdominal   Peds  Hematology   Anesthesia Other Findings   Reproductive/Obstetrics                            Anesthesia Physical Anesthesia Plan  ASA: III  Anesthesia Plan: General   Post-op Pain Management:    Induction: Intravenous  Airway Management Planned: Oral ETT  Additional Equipment:   Intra-op Plan:   Post-operative Plan: Extubation in OR  Informed Consent: I have reviewed the patients History and Physical, chart, labs and discussed the procedure including the risks, benefits and alternatives for the proposed anesthesia with the patient or authorized representative who has indicated his/her understanding and acceptance.   Dental advisory given  Plan Discussed with: CRNA and Anesthesiologist  Anesthesia Plan Comments:         Anesthesia Quick Evaluation

## 2015-11-15 ENCOUNTER — Encounter (HOSPITAL_COMMUNITY): Payer: Self-pay | Admitting: General Surgery

## 2016-03-28 ENCOUNTER — Other Ambulatory Visit: Payer: Self-pay | Admitting: Critical Care Medicine

## 2016-06-09 ENCOUNTER — Other Ambulatory Visit: Payer: Self-pay | Admitting: Adult Health

## 2016-10-09 DIAGNOSIS — J069 Acute upper respiratory infection, unspecified: Secondary | ICD-10-CM | POA: Diagnosis not present

## 2016-10-16 ENCOUNTER — Other Ambulatory Visit: Payer: Self-pay | Admitting: Adult Health

## 2016-11-22 ENCOUNTER — Other Ambulatory Visit: Payer: Self-pay | Admitting: Adult Health

## 2017-02-14 DIAGNOSIS — R5383 Other fatigue: Secondary | ICD-10-CM | POA: Diagnosis not present

## 2017-02-14 DIAGNOSIS — R635 Abnormal weight gain: Secondary | ICD-10-CM | POA: Diagnosis not present

## 2017-02-14 DIAGNOSIS — Z6841 Body Mass Index (BMI) 40.0 and over, adult: Secondary | ICD-10-CM | POA: Diagnosis not present

## 2017-03-14 DIAGNOSIS — R635 Abnormal weight gain: Secondary | ICD-10-CM | POA: Diagnosis not present

## 2017-03-14 DIAGNOSIS — R5383 Other fatigue: Secondary | ICD-10-CM | POA: Diagnosis not present

## 2017-04-04 DIAGNOSIS — R635 Abnormal weight gain: Secondary | ICD-10-CM | POA: Diagnosis not present

## 2017-04-11 DIAGNOSIS — R635 Abnormal weight gain: Secondary | ICD-10-CM | POA: Diagnosis not present

## 2017-04-11 DIAGNOSIS — E669 Obesity, unspecified: Secondary | ICD-10-CM | POA: Diagnosis not present

## 2017-04-11 DIAGNOSIS — Z6841 Body Mass Index (BMI) 40.0 and over, adult: Secondary | ICD-10-CM | POA: Diagnosis not present

## 2017-05-10 DIAGNOSIS — E669 Obesity, unspecified: Secondary | ICD-10-CM | POA: Diagnosis not present

## 2017-05-10 DIAGNOSIS — R635 Abnormal weight gain: Secondary | ICD-10-CM | POA: Diagnosis not present

## 2017-05-10 DIAGNOSIS — Z6841 Body Mass Index (BMI) 40.0 and over, adult: Secondary | ICD-10-CM | POA: Diagnosis not present

## 2017-10-09 DIAGNOSIS — J029 Acute pharyngitis, unspecified: Secondary | ICD-10-CM | POA: Diagnosis not present

## 2017-10-09 DIAGNOSIS — Z6841 Body Mass Index (BMI) 40.0 and over, adult: Secondary | ICD-10-CM | POA: Diagnosis not present

## 2017-10-14 DIAGNOSIS — J019 Acute sinusitis, unspecified: Secondary | ICD-10-CM | POA: Diagnosis not present

## 2017-10-14 DIAGNOSIS — Z6841 Body Mass Index (BMI) 40.0 and over, adult: Secondary | ICD-10-CM | POA: Diagnosis not present

## 2018-10-10 DIAGNOSIS — Z6841 Body Mass Index (BMI) 40.0 and over, adult: Secondary | ICD-10-CM | POA: Diagnosis not present

## 2018-10-10 DIAGNOSIS — K219 Gastro-esophageal reflux disease without esophagitis: Secondary | ICD-10-CM | POA: Diagnosis not present

## 2018-10-10 DIAGNOSIS — G43909 Migraine, unspecified, not intractable, without status migrainosus: Secondary | ICD-10-CM | POA: Diagnosis not present

## 2018-10-10 DIAGNOSIS — Z1331 Encounter for screening for depression: Secondary | ICD-10-CM | POA: Diagnosis not present

## 2019-10-28 DIAGNOSIS — B0089 Other herpesviral infection: Secondary | ICD-10-CM | POA: Diagnosis not present

## 2019-11-30 DIAGNOSIS — Z79899 Other long term (current) drug therapy: Secondary | ICD-10-CM | POA: Diagnosis not present

## 2019-11-30 DIAGNOSIS — Z1331 Encounter for screening for depression: Secondary | ICD-10-CM | POA: Diagnosis not present

## 2019-11-30 DIAGNOSIS — R635 Abnormal weight gain: Secondary | ICD-10-CM | POA: Diagnosis not present

## 2019-11-30 DIAGNOSIS — K219 Gastro-esophageal reflux disease without esophagitis: Secondary | ICD-10-CM | POA: Diagnosis not present

## 2019-11-30 DIAGNOSIS — G4700 Insomnia, unspecified: Secondary | ICD-10-CM | POA: Diagnosis not present

## 2019-11-30 DIAGNOSIS — G43909 Migraine, unspecified, not intractable, without status migrainosus: Secondary | ICD-10-CM | POA: Diagnosis not present

## 2019-12-17 DIAGNOSIS — B373 Candidiasis of vulva and vagina: Secondary | ICD-10-CM | POA: Diagnosis not present

## 2019-12-31 DIAGNOSIS — F418 Other specified anxiety disorders: Secondary | ICD-10-CM | POA: Diagnosis not present

## 2019-12-31 DIAGNOSIS — G4700 Insomnia, unspecified: Secondary | ICD-10-CM | POA: Diagnosis not present

## 2019-12-31 DIAGNOSIS — Z6841 Body Mass Index (BMI) 40.0 and over, adult: Secondary | ICD-10-CM | POA: Diagnosis not present

## 2019-12-31 DIAGNOSIS — K219 Gastro-esophageal reflux disease without esophagitis: Secondary | ICD-10-CM | POA: Diagnosis not present

## 2020-02-03 DIAGNOSIS — F418 Other specified anxiety disorders: Secondary | ICD-10-CM | POA: Diagnosis not present

## 2020-02-03 DIAGNOSIS — Z6841 Body Mass Index (BMI) 40.0 and over, adult: Secondary | ICD-10-CM | POA: Diagnosis not present

## 2020-02-03 DIAGNOSIS — G4700 Insomnia, unspecified: Secondary | ICD-10-CM | POA: Diagnosis not present

## 2020-03-10 DIAGNOSIS — G4700 Insomnia, unspecified: Secondary | ICD-10-CM | POA: Diagnosis not present

## 2020-03-10 DIAGNOSIS — Z6841 Body Mass Index (BMI) 40.0 and over, adult: Secondary | ICD-10-CM | POA: Diagnosis not present

## 2020-04-14 DIAGNOSIS — G4700 Insomnia, unspecified: Secondary | ICD-10-CM | POA: Diagnosis not present

## 2020-04-14 DIAGNOSIS — Z6841 Body Mass Index (BMI) 40.0 and over, adult: Secondary | ICD-10-CM | POA: Diagnosis not present

## 2020-04-15 DIAGNOSIS — D485 Neoplasm of uncertain behavior of skin: Secondary | ICD-10-CM | POA: Diagnosis not present

## 2020-04-15 DIAGNOSIS — L700 Acne vulgaris: Secondary | ICD-10-CM | POA: Diagnosis not present

## 2020-04-15 DIAGNOSIS — D3617 Benign neoplasm of peripheral nerves and autonomic nervous system of trunk, unspecified: Secondary | ICD-10-CM | POA: Diagnosis not present

## 2020-05-17 DIAGNOSIS — J209 Acute bronchitis, unspecified: Secondary | ICD-10-CM | POA: Diagnosis not present

## 2020-08-02 DIAGNOSIS — B373 Candidiasis of vulva and vagina: Secondary | ICD-10-CM | POA: Diagnosis not present

## 2023-07-08 ENCOUNTER — Other Ambulatory Visit (HOSPITAL_BASED_OUTPATIENT_CLINIC_OR_DEPARTMENT_OTHER): Payer: Self-pay

## 2023-07-08 ENCOUNTER — Emergency Department (HOSPITAL_BASED_OUTPATIENT_CLINIC_OR_DEPARTMENT_OTHER)
Admission: EM | Admit: 2023-07-08 | Discharge: 2023-07-08 | Disposition: A | Discharge status: Home / Self Care | Payer: BC Managed Care – PPO | Attending: Emergency Medicine | Admitting: Emergency Medicine

## 2023-07-08 ENCOUNTER — Encounter (HOSPITAL_BASED_OUTPATIENT_CLINIC_OR_DEPARTMENT_OTHER): Payer: Self-pay | Admitting: Pediatrics

## 2023-07-08 ENCOUNTER — Other Ambulatory Visit: Payer: Self-pay

## 2023-07-08 ENCOUNTER — Emergency Department (HOSPITAL_BASED_OUTPATIENT_CLINIC_OR_DEPARTMENT_OTHER): Payer: BC Managed Care – PPO

## 2023-07-08 DIAGNOSIS — D582 Other hemoglobinopathies: Secondary | ICD-10-CM | POA: Diagnosis not present

## 2023-07-08 DIAGNOSIS — E039 Hypothyroidism, unspecified: Secondary | ICD-10-CM | POA: Insufficient documentation

## 2023-07-08 DIAGNOSIS — R1084 Generalized abdominal pain: Secondary | ICD-10-CM | POA: Diagnosis present

## 2023-07-08 DIAGNOSIS — K529 Noninfective gastroenteritis and colitis, unspecified: Secondary | ICD-10-CM | POA: Diagnosis not present

## 2023-07-08 DIAGNOSIS — D72829 Elevated white blood cell count, unspecified: Secondary | ICD-10-CM | POA: Insufficient documentation

## 2023-07-08 LAB — CBC WITH DIFFERENTIAL/PLATELET
Abs Immature Granulocytes: 0.04 10*3/uL (ref 0.00–0.07)
Basophils Absolute: 0.1 10*3/uL (ref 0.0–0.1)
Basophils Relative: 0 %
Eosinophils Absolute: 0.2 10*3/uL (ref 0.0–0.5)
Eosinophils Relative: 2 %
HCT: 46.5 % — ABNORMAL HIGH (ref 36.0–46.0)
Hemoglobin: 16.2 g/dL — ABNORMAL HIGH (ref 12.0–15.0)
Immature Granulocytes: 0 %
Lymphocytes Relative: 12 %
Lymphs Abs: 1.5 10*3/uL (ref 0.7–4.0)
MCH: 29.7 pg (ref 26.0–34.0)
MCHC: 34.8 g/dL (ref 30.0–36.0)
MCV: 85.2 fL (ref 80.0–100.0)
Monocytes Absolute: 0.6 10*3/uL (ref 0.1–1.0)
Monocytes Relative: 5 %
Neutro Abs: 10.1 10*3/uL — ABNORMAL HIGH (ref 1.7–7.7)
Neutrophils Relative %: 81 %
Platelets: 258 10*3/uL (ref 150–400)
RBC: 5.46 MIL/uL — ABNORMAL HIGH (ref 3.87–5.11)
RDW: 12.9 % (ref 11.5–15.5)
WBC: 12.6 10*3/uL — ABNORMAL HIGH (ref 4.0–10.5)
nRBC: 0 % (ref 0.0–0.2)

## 2023-07-08 LAB — URINALYSIS, MICROSCOPIC (REFLEX)

## 2023-07-08 LAB — COMPREHENSIVE METABOLIC PANEL
ALT: 32 U/L (ref 0–44)
AST: 27 U/L (ref 15–41)
Albumin: 3.5 g/dL (ref 3.5–5.0)
Alkaline Phosphatase: 77 U/L (ref 38–126)
Anion gap: 9 (ref 5–15)
BUN: 12 mg/dL (ref 6–20)
CO2: 23 mmol/L (ref 22–32)
Calcium: 8.4 mg/dL — ABNORMAL LOW (ref 8.9–10.3)
Chloride: 103 mmol/L (ref 98–111)
Creatinine, Ser: 0.8 mg/dL (ref 0.44–1.00)
GFR, Estimated: >60 mL/min (ref >60)
Glucose, Bld: 92 mg/dL (ref 70–99)
Potassium: 3.9 mmol/L (ref 3.5–5.1)
Sodium: 135 mmol/L (ref 135–145)
Total Bilirubin: 0.8 mg/dL (ref <1.2)
Total Protein: 6.1 g/dL — ABNORMAL LOW (ref 6.5–8.1)

## 2023-07-08 LAB — URINALYSIS, ROUTINE W REFLEX MICROSCOPIC
Bilirubin Urine: NEGATIVE
Glucose, UA: NEGATIVE mg/dL
Ketones, ur: 15 mg/dL — AB
Leukocytes,Ua: NEGATIVE
Nitrite: NEGATIVE
Protein, ur: NEGATIVE mg/dL
Specific Gravity, Urine: 1.02 (ref 1.005–1.030)
pH: 5.5 (ref 5.0–8.0)

## 2023-07-08 LAB — LIPASE, BLOOD: Lipase: 24 U/L (ref 11–51)

## 2023-07-08 LAB — HCG, SERUM, QUALITATIVE: Preg, Serum: NEGATIVE

## 2023-07-08 MED ORDER — IOHEXOL 300 MG/ML  SOLN
100.0000 mL | Freq: Once | INTRAMUSCULAR | Status: AC | PRN
  Administered 2023-07-08: 100 mL via INTRAVENOUS

## 2023-07-08 MED ORDER — MORPHINE SULFATE (PF) 4 MG/ML IV SOLN
4.0000 mg | Freq: Once | INTRAVENOUS | Status: AC
  Administered 2023-07-08: 4 mg via INTRAVENOUS
  Filled 2023-07-08: qty 1

## 2023-07-08 MED ORDER — HYDROCODONE-ACETAMINOPHEN 5-325 MG PO TABS
1.0000 | ORAL_TABLET | ORAL | 0 refills | Status: AC | PRN
  Filled 2023-07-08: qty 10, 2d supply, fill #0

## 2023-07-08 MED ORDER — ONDANSETRON 4 MG PO TBDP
4.0000 mg | ORAL_TABLET | Freq: Three times a day (TID) | ORAL | 0 refills | Status: AC | PRN
  Filled 2023-07-08: qty 12, 4d supply, fill #0

## 2023-07-08 MED ORDER — ONDANSETRON HCL 4 MG/2ML IJ SOLN
4.0000 mg | Freq: Once | INTRAMUSCULAR | Status: AC
  Administered 2023-07-08: 4 mg via INTRAVENOUS
  Filled 2023-07-08: qty 2

## 2023-07-08 NOTE — ED Notes (Signed)
Pt states she really does not have to urinate at this time, will try again shortly

## 2023-07-08 NOTE — ED Notes (Signed)
Report rec'd from prev RN 

## 2023-07-08 NOTE — ED Notes (Signed)
Pt had attempted urine sample.  Lab states it was an insufficient quantity.  New urine cup place at bedside.

## 2023-07-08 NOTE — Discharge Instructions (Addendum)
Bland diet. Norco as needed as prescribed for pain. Do not drive or operate machinery if taking Norco. Zofran as needed as prescribed for nausea and vomiting.  Return to the ER for pain not controlled with Norco, vomiting not controlled with Zofran, fever over 100.4, or any worsening or concerning symptoms.  Follow up with GI, please call to schedule an appointment.

## 2023-07-08 NOTE — ED Triage Notes (Signed)
C/O abdominal cramps started when her period started last week on 10/28; worst the day after; Stated she pushed hard to have a Bm and had pink streak when she wiped; but this morning when she had a bowel movent it was all blood; denies blood thinners.

## 2023-07-08 NOTE — ED Provider Notes (Signed)
[facility] Provider Note   CSN: [id number] Arrival date & time: 07/08/23  1052     History  Chief Complaint  Patient presents with   Abdominal Pain   Rectal Bleeding    Ellen Parsons is a 42 y.o. female.  42 year old female with past medical history of hypothyroid, GERD, migraines presents with complaint of generalized abdominal discomfort described as cramping in nature, severity waxes and wanes.  Symptoms started 4 days ago.  Patient reports having significant diarrhea yesterday, after wiping noted pink blood on the tissue paper but did not think anything of this.  Since then has had 4 episodes of blood in the commode.  She is not anticoagulated.  Prior abdominal surgery includes laparoscopic cholecystectomy.  Denies nausea, vomiting, fevers, chills.  No other complaints or concerns.  Reports having had a colonoscopy as a child otherwise has not had a colonoscopy.       Home Medications Prior to Admission medications   Medication Sig Start Date End Date Taking? Authorizing Provider  HYDROcodone-acetaminophen (NORCO/VICODIN) 5-325 MG tablet Take 1 tablet by mouth every 4 (four) hours as needed. 07/08/23  Yes Jeannie Fend, PA-C  ondansetron (ZOFRAN-ODT) 4 MG disintegrating tablet Take 1 tablet (4 mg total) by mouth every 8 (eight) hours as needed for nausea or vomiting. 07/08/23  Yes Jeannie Fend, PA-C  Ascorbic Acid (VITAMIN C GUMMIE PO) Take 1 tablet by mouth daily.     [provider]  Cholecalciferol (VITAMIN D PO) Take 1 tablet by mouth daily.    [provider]  Cyanocobalamin (VITAMIN B-12 PO) Take 1 tablet by mouth daily.    [provider]  Magnesium 500 MG TABS Take 1 tablet by mouth daily.    [provider]  Multiple Vitamins-Minerals (MULTI-VITAMIN GUMMIES PO) Take 1 tablet by mouth daily.     [provider]  naproxen sodium (ANAPROX) 220 MG tablet Take 220 mg by mouth  daily as needed (for pain). As needed    [provider]  omeprazole (PRILOSEC) 40 MG capsule Take 40 mg by mouth daily. 08/11/15   [provider]  ondansetron (ZOFRAN) 4 MG tablet Take 1 tablet (4 mg total) by mouth every 4 (four) hours as needed for nausea. 11/14/15   Avel Peace, MD  ondansetron (ZOFRAN) 4 MG tablet Take 1 tablet (4 mg total) by mouth every 4 (four) hours as needed for nausea or vomiting. 11/14/15   Avel Peace, MD  PROAIR HFA 108 [phone] Base) MCG/ACT inhaler INHALE 2 PUFFS INTO THE LUNGS EVERY 6 (SIX) HOURS AS NEEDED. 06/11/16   Parrett, Virgel Bouquet, NP      Allergies    Sulfa antibiotics    Review of Systems   Review of Systems Negative except as per HPI Physical Exam Updated Vital Signs BP 123/68   Pulse 65   Temp 98.7 F (37.1 C)   Resp 18   Ht 5\' 4"  (1.626 m)   Wt 108.9 kg   LMP 07/01/2023   SpO2 97%   BMI 41.20 kg/m  Physical Exam Vitals and nursing note reviewed.  Constitutional:      General: She is not in acute distress.    Appearance: She is well-developed. She is obese. She is not diaphoretic.  HENT:     Head: Normocephalic and atraumatic.  Cardiovascular:     Rate and Rhythm: Normal rate and regular rhythm.     Heart sounds: Normal heart sounds.  Pulmonary:     Effort: Pulmonary effort is normal.     Breath sounds: Normal breath sounds.  Abdominal:     Palpations: Abdomen is soft.     Tenderness: There is generalized abdominal tenderness and tenderness in the left lower quadrant.  Skin:    General: Skin is warm and dry.     Findings: No erythema or rash.  Neurological:     Mental Status: She is alert and oriented to person, place, and time.  Psychiatric:        Behavior: Behavior normal.     ED Results / Procedures / Treatments   Labs (all labs ordered are listed, but only abnormal results are displayed) Labs Reviewed  CBC WITH DIFFERENTIAL/PLATELET - Abnormal; Notable for the following components:      Result  Value   WBC 12.6 (*)    RBC 5.46 (*)    Hemoglobin 16.2 (*)    HCT 46.5 (*)    Neutro Abs 10.1 (*)    All other components within normal limits  COMPREHENSIVE METABOLIC PANEL - Abnormal; Notable for the following components:   Calcium 8.4 (*)    Total Protein 6.1 (*)    All other components within normal limits  URINALYSIS, ROUTINE W REFLEX MICROSCOPIC - Abnormal; Notable for the following components:   Hgb urine dipstick TRACE (*)    Ketones, ur 15 (*)    All other components within normal limits  URINALYSIS, MICROSCOPIC (REFLEX) - Abnormal; Notable for the following components:   Bacteria, UA FEW (*)    All other components within normal limits  LIPASE, BLOOD  HCG, SERUM, QUALITATIVE    EKG None  Radiology CT ABDOMEN PELVIS W CONTRAST  Result Date: 07/08/2023 CLINICAL DATA:  LEFT lower quadrant abdominal pain EXAM: CT ABDOMEN AND PELVIS WITH CONTRAST TECHNIQUE: Multidetector CT imaging of the abdomen and pelvis was performed using the standard protocol following bolus administration of intravenous contrast. RADIATION DOSE REDUCTION: This exam was performed according to the departmental dose-optimization program which includes automated exposure control, adjustment of the mA and/or kV according to patient size and/or use of iterative reconstruction technique. CONTRAST:  OMNIPAQUE IOHEXOL 300 MG/ML  SOLN COMPARISON:  None Available. FINDINGS: Lower chest: Lung bases are clear. Hepatobiliary: No focal hepatic lesion. Postcholecystectomy. No biliary dilatation. Pancreas: Pancreas is normal. No ductal dilatation. No pancreatic inflammation. Spleen: Normal spleen Adrenals/urinary tract: Adrenal glands and kidneys are normal. The ureters and bladder normal. Stomach/Bowel: Stomach, duodenum and small-bowel normal. Appendix normal. Ascending colon and proximal transverse colon normal. Beginning in the mid transverse colon and extending to the splenic flexure of the colon there is uniform  submucosal edema and mild pericolonic fat inflammation (image 31/series 301 for example). The descending colon is normal without mucosal edema. The rectosigmoid colon is normal. No diverticula through the region of submucosal edema involving the distal transverse colon. No perforation or abscess. No pneumatosis or portal venous gas the arterial vessels are normal. Vascular/Lymphatic: Abdominal aorta is normal caliber. No periportal or retroperitoneal adenopathy. No pelvic adenopathy. Reproductive: Uterus and adnexa unremarkable. Other: No free fluid. Musculoskeletal: No aggressive osseous lesion. IMPRESSION: 1. Submucosal edema and pericolonic fat inflammation involving the mid transverse colon up to the splenic flexure of the colon. Findings consistent segmental colitis. Differential include infectious colitis, inflammatory bowel disease, or less likely ischemic colitis. Mesenteric arterial vasculature appears normal. 2. No perforation or abscess. 3. Normal appendix. 4. Postcholecystectomy. Electronically Signed   By: Loura Halt.D.  On: 07/08/2023 15:57    Procedures Procedures    Medications Ordered in ED Medications  ondansetron (ZOFRAN) injection 4 mg (4 mg Intravenous Given 07/08/23 1216)  morphine (PF) 4 MG/ML injection 4 mg (4 mg Intravenous Given 07/08/23 1216)  iohexol (OMNIPAQUE) 300 MG/ML solution 100 mL (100 mLs Intravenous Contrast Given 07/08/23 1323)  morphine (PF) 4 MG/ML injection 4 mg (4 mg Intravenous Given 07/08/23 1635)    ED Course/ Medical Decision Making/ A&P                                 Medical Decision Making Amount and/or Complexity of Data Reviewed Labs: ordered. Radiology: ordered.  Risk Prescription drug management.   This patient presents to the ED for concern of abdominal pain, rectal bleeding, this involves an extensive number of treatment options, and is a complaint that carries with it a high risk of complications and morbidity.  The differential  diagnosis includes but not limited to hemorrhoids, colitis, diverticulitis, crohn's    Co morbidities that complicate the patient evaluation  Migraines, asthma, GERD   Additional history obtained:  Additional history obtained from mom at bedside who contributes to history as above External records from outside source obtained and reviewed including prior labs on file for comparison    Lab Tests:  I Ordered, and personally interpreted labs.  The pertinent results include:  UA with trace ketones. Lipase WNL. CBC with mild leukocytosis at 12.6. Hgb elevated at 16.2. CMP without significant findings.    Imaging Studies ordered:  I ordered imaging studies including CT a/p with contrast   I independently visualized and interpreted imaging which showed colitis, no free air I agree with the radiologist interpretation   Consultations Obtained:  I requested consultation with the Er attending, Dr. Tomma Lightning,  and discussed lab and imaging findings as well as pertinent plan - they recommend: refer to GI for further workup    Problem List / ED Course / Critical interventions / Medication management  42 yo female with abdominal pain, now with rectal bleeding. Mild generalized tenderness, more so in the LLQ. Labs with mild leukocytosis. CT with colitis as the splenic flexure. No free air, vasculature unremarkable. Discussed results with pt, will dc with bland diet instructions, norco and zofran for symptom management with referral to GI and return to ER precautions.  I ordered medication including morphine, zofran  for pain  Reevaluation of the patient after these medicines showed that the patient improved I have reviewed the patients home medicines and have made adjustments as needed   Social Determinants of Health:  Has PCP   Test / Admission - Considered:  Stable for dc         Final Clinical Impression(s) / ED Diagnoses Final diagnoses:  Colitis    Rx / DC Orders ED  Discharge Orders          Ordered    HYDROcodone-acetaminophen (NORCO/VICODIN) 5-325 MG tablet  Every 4 hours PRN        07/08/23 1632    ondansetron (ZOFRAN-ODT) 4 MG disintegrating tablet  Every 8 hours PRN        07/08/23 1632              Jeannie Fend, PA-C 07/08/23 1904    Alvira Monday, MD 07/09/23 [phone]

## 2023-07-08 NOTE — ED Notes (Signed)
Pt off the floor in CT
